# Patient Record
Sex: Female | Born: 1945 | Race: White | Hispanic: No | Marital: Married | Smoking: Never smoker
Health system: Southern US, Community
[De-identification: ages and names within clinical notes are randomized; demographics above are authoritative.]

## PROBLEM LIST (undated history)

## (undated) DIAGNOSIS — N390 Urinary tract infection, site not specified: Secondary | ICD-10-CM

## (undated) HISTORY — PX: EYE SURGERY: SHX253

---

## 2008-01-30 ENCOUNTER — Encounter: Admission: RE | Admit: 2008-01-30 | Discharge: 2008-01-30 | Payer: Self-pay | Admitting: Internal Medicine

## 2011-06-23 ENCOUNTER — Encounter (HOSPITAL_COMMUNITY): Payer: Self-pay

## 2011-06-23 ENCOUNTER — Emergency Department (INDEPENDENT_AMBULATORY_CARE_PROVIDER_SITE_OTHER)
Admission: EM | Admit: 2011-06-23 | Discharge: 2011-06-23 | Disposition: A | Discharge status: Home / Self Care | Payer: Worker's Compensation | Source: Home / Self Care | Attending: Emergency Medicine | Admitting: Emergency Medicine

## 2011-06-23 DIAGNOSIS — S40029A Contusion of unspecified upper arm, initial encounter: Secondary | ICD-10-CM

## 2011-06-23 NOTE — Discharge Instructions (Signed)
This is a hematoma, a collection of blood underneath the skin. Your body will reabsorb the blood. You may use ice for pain and swelling over the next 24 to 48 hours, taking care not to burn (freeze) the skin which can lead to permanent damage. Use may use mild compression (ACE bandage) and warm compresses. Return to care should your symptoms not improve, or worsen in any way.

## 2011-06-23 NOTE — ED Provider Notes (Signed)
History     CSN: [id number]  Arrival date & time 06/23/11  1441   First MD Initiated Contact with Patient 06/23/11 1455      Chief Complaint  Patient presents with  . Arm Injury    (Consider location/radiation/quality/duration/timing/severity/associated sxs/prior treatment) HPI Comments: Alicia Hobbs presents for evaluation of pain, swelling, bruising of her right forearm. She reports that she works at a [employer] and struck her arm on a large plexiglass display. She then noted immediate swelling, bruising, and pain over her forearm. She denies any numbness tingling, or weakness in her upper extremity. She's not on any blood thinners. In fact, she takes no medications other than fish oil.  Patient is a 66 y.o. female presenting with arm injury. The history is provided by the patient.  Arm Injury  The incident occurred just prior to arrival. The incident occurred at work. The injury mechanism was a direct blow. The wounds were self-inflicted. There is an injury to the right forearm. The pain is moderate. Pertinent negatives include no tingling. She is right-handed.    History reviewed. No pertinent past medical history.  History reviewed. No pertinent past surgical history.  History reviewed. No pertinent family history.  History  Substance Use Topics  . Smoking status: Never Smoker   . Smokeless tobacco: Not on file  . Alcohol Use: No    OB History    Grav Para Term Preterm Abortions TAB SAB Ect Mult Living                  Review of Systems  Constitutional: Negative.   HENT: Negative.   Eyes: Negative.   Respiratory: Negative.   Cardiovascular: Negative.   Gastrointestinal: Negative.   Genitourinary: Negative.   Musculoskeletal:       RIGHT forearm hematoma  Skin: Positive for color change and wound.  Neurological: Negative.  Negative for tingling.    Allergies  Review of patient's allergies indicates no known allergies.  Home Medications  No current  outpatient prescriptions on file.  BP 145/90  Pulse 80  Temp(Src) 97.8 F (36.6 C) (Oral)  Resp 16  SpO2 98%  Physical Exam  Nursing note and vitals reviewed. Constitutional: She is oriented to person, place, and time. She appears well-developed and well-nourished.  HENT:  Head: Normocephalic and atraumatic.  Eyes: EOM are normal.  Neck: Normal range of motion.  Pulmonary/Chest: Effort normal.  Musculoskeletal: Normal range of motion.       Arms:      Large 5 cm hematoma over lateral aspect of RIGHT forearm, tender to palpation  Neurological: She is alert and oriented to person, place, and time.  Skin: Skin is warm and dry. Bruising and ecchymosis noted.  Psychiatric: Her behavior is normal.    ED Course  Procedures (including critical care time)  Labs Reviewed - No data to display No results found.   1. Hematoma of arm       MDM  Supportive care, reassured        Renaee Munda, MD 06/23/11 [phone]

## 2011-06-23 NOTE — ED Notes (Signed)
Pt hit rt forearm on display at work today at 1200.  Arm is painful and large hematoma on arm.

## 2012-11-06 ENCOUNTER — Encounter (HOSPITAL_COMMUNITY): Payer: Self-pay | Admitting: *Deleted

## 2012-11-06 ENCOUNTER — Emergency Department (HOSPITAL_COMMUNITY)
Admission: EM | Admit: 2012-11-06 | Discharge: 2012-11-06 | Disposition: A | Discharge status: Home / Self Care | Payer: Worker's Compensation | Attending: Emergency Medicine | Admitting: Emergency Medicine

## 2012-11-06 ENCOUNTER — Emergency Department (HOSPITAL_COMMUNITY): Payer: Worker's Compensation

## 2012-11-06 DIAGNOSIS — S52599A Other fractures of lower end of unspecified radius, initial encounter for closed fracture: Secondary | ICD-10-CM | POA: Insufficient documentation

## 2012-11-06 DIAGNOSIS — S42202A Unspecified fracture of upper end of left humerus, initial encounter for closed fracture: Secondary | ICD-10-CM

## 2012-11-06 DIAGNOSIS — W19XXXA Unspecified fall, initial encounter: Secondary | ICD-10-CM

## 2012-11-06 DIAGNOSIS — Z79899 Other long term (current) drug therapy: Secondary | ICD-10-CM | POA: Insufficient documentation

## 2012-11-06 DIAGNOSIS — W010XXA Fall on same level from slipping, tripping and stumbling without subsequent striking against object, initial encounter: Secondary | ICD-10-CM | POA: Insufficient documentation

## 2012-11-06 DIAGNOSIS — S52502A Unspecified fracture of the lower end of left radius, initial encounter for closed fracture: Secondary | ICD-10-CM

## 2012-11-06 DIAGNOSIS — Y929 Unspecified place or not applicable: Secondary | ICD-10-CM | POA: Insufficient documentation

## 2012-11-06 DIAGNOSIS — S42209A Unspecified fracture of upper end of unspecified humerus, initial encounter for closed fracture: Secondary | ICD-10-CM | POA: Insufficient documentation

## 2012-11-06 DIAGNOSIS — Y939 Activity, unspecified: Secondary | ICD-10-CM | POA: Insufficient documentation

## 2012-11-06 MED ORDER — HYDROCODONE-ACETAMINOPHEN 5-325 MG PO TABS: 2.0000 | ORAL_TABLET | ORAL | Status: DC | PRN

## 2012-11-06 MED ORDER — FENTANYL CITRATE 0.05 MG/ML IJ SOLN
100.0000 ug | Freq: Once | INTRAMUSCULAR | Status: AC
  Administered 2012-11-06: 100 ug via INTRAVENOUS
  Filled 2012-11-06: qty 2

## 2012-11-06 MED ORDER — FENTANYL CITRATE 0.05 MG/ML IJ SOLN
50.0000 ug | Freq: Once | INTRAMUSCULAR | Status: AC
  Administered 2012-11-06: 50 ug via INTRAVENOUS
  Filled 2012-11-06: qty 2

## 2012-11-06 MED ORDER — HYDROCODONE-ACETAMINOPHEN 5-325 MG PO TABS
1.0000 | ORAL_TABLET | Freq: Once | ORAL | Status: AC
  Administered 2012-11-06: 1 via ORAL
  Filled 2012-11-06: qty 1

## 2012-11-06 MED ORDER — ONDANSETRON HCL 4 MG/2ML IJ SOLN
4.0000 mg | Freq: Once | INTRAMUSCULAR | Status: AC
  Administered 2012-11-06: 4 mg via INTRAVENOUS
  Filled 2012-11-06: qty 2

## 2012-11-06 NOTE — ED Notes (Signed)
Patient transported to X-ray 

## 2012-11-06 NOTE — ED Notes (Signed)
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 Pt tripped over shelf in floor and fell onto her left side. Pt c/o left arm pain only.

## 2012-11-06 NOTE — ED Provider Notes (Signed)
CSN: [id number]     Arrival date & time 11/06/12  2124 History   First MD Initiated Contact with Patient 11/06/12 2132     Chief Complaint  Patient presents with  . Fall   (Consider location/radiation/quality/duration/timing/severity/associated sxs/prior Treatment) Patient is a 67 y.o. female presenting with fall. The history is provided by the patient.  Fall Pertinent negatives include no chest pain, no abdominal pain, no headaches and no shortness of breath.  patient presents after a trip and a fall. Complaining of pain in her left shoulder and left wrist.  No head or neck pain. No chest or abdominal pain. No numbness or weakness. She is not on anticoagulation.   History reviewed. No pertinent past medical history. Past Surgical History  Procedure Laterality Date  . Eye surgery     History reviewed. No pertinent family history. History  Substance Use Topics  . Smoking status: Never Smoker   . Smokeless tobacco: Never Used  . Alcohol Use: No   OB History   Grav Para Term Preterm Abortions TAB SAB Ect Mult Living                 Review of Systems  Constitutional: Negative for activity change and appetite change.  HENT: Negative for neck stiffness.   Eyes: Negative for pain.  Respiratory: Negative for chest tightness and shortness of breath.   Cardiovascular: Negative for chest pain and leg swelling.  Gastrointestinal: Negative for nausea, vomiting, abdominal pain and diarrhea.  Genitourinary: Negative for flank pain.  Musculoskeletal: Negative for back pain.  Skin: Negative for rash.  Neurological: Negative for weakness, numbness and headaches.  Psychiatric/Behavioral: Negative for behavioral problems.    Allergies  Biaxin  Home Medications   Current Outpatient Rx  Name  Route  Sig  Dispense  Refill  . b complex vitamins tablet   Oral   Take 1 tablet by mouth daily.         . cholecalciferol (VITAMIN D) 1000 UNITS tablet   Oral   Take 1,000 Units by mouth  daily.         Marland Kitchen co-enzyme Q-10 50 MG capsule   Oral   Take 50 mg by mouth daily.         Marland Kitchen doxylamine, Sleep, (EQ NIGHTTIME SLEEP AID, DOXYL,) 25 MG tablet   Oral   Take 25 mg by mouth at bedtime as needed for sleep.         . fish oil-omega-3 fatty acids 1000 MG capsule   Oral   Take 2 g by mouth daily.         . Multiple Vitamin (MULTIVITAMIN WITH MINERALS) TABS tablet   Oral   Take 1 tablet by mouth daily.         . vitamin C (ASCORBIC ACID) 500 MG tablet   Oral   Take 500 mg by mouth daily.         Marland Kitchen HYDROcodone-acetaminophen (NORCO/VICODIN) 5-325 MG per tablet   Oral   Take 2 tablets by mouth every 4 (four) hours as needed for pain.   10 tablet   0    BP 124/68  Pulse 74  Temp(Src) 97.7 F (36.5 C) (Oral)  Resp 18  SpO2 96% Physical Exam  Constitutional: She is oriented to person, place, and time. She appears well-developed and well-nourished.  HENT:  Head: Normocephalic.  Neck: Normal range of motion. Neck supple.  Cardiovascular: Normal rate and regular rhythm.   Pulmonary/Chest: Effort normal and breath  sounds normal.  Abdominal: Soft.  Musculoskeletal: She exhibits tenderness.  Tenderness to proximal left shoulder. Decreased range of motion. No tenderness over elbow. Tenderness and decreased range of motion of her wrist. Sensation intact in hand.  Neurological: She is alert and oriented to person, place, and time.  Skin: Skin is warm.    ED Course  Procedures (including critical care time) Labs Review Labs Reviewed - No data to display Imaging Review Dg Wrist Complete Left  11/06/2012   *RADIOLOGY REPORT*  Clinical Data: Fall.  Left wrist pain.  LEFT WRIST - COMPLETE 3+ VIEW  Comparison: None.  Findings: There is a transverse fracture of the distal radial metaphysis.  This leads to mild dorsal angulation of the articular surface of 12 degrees.  This is due to mild impaction of the dorsal aspect of the fracture.  There is no displacement.   There are no other fractures.  The wrist joints are normally spaced and aligned. There is surrounding soft tissue swelling.  The bones are demineralized.  IMPRESSION: Transverse fracture of the distal left radial metaphysis.   Original Report Authenticated By: Amie Portland, M.D.   Dg Shoulder Left  11/06/2012   *RADIOLOGY REPORT*  Clinical Data: Fall with left shoulder pain  LEFT SHOULDER - 2+ VIEW  Comparison: 11/06/2004  Findings: There is a fracture of the proximal left humeral metaphysis.  There is a fracture of the proximal humeral metaphysis on the prior study.  Presumably, this appears to be a new fracture. The shaft fracture fragment has retracted superiorly by 1 to 2 cm. There is no dislocation.  There is minimal comminution.  IMPRESSION: Fracture of the proximal humeral metaphysis with mild displacement.   Original Report Authenticated By: Amie Portland, M.D.    MDM   1. Fall, initial encounter   2. Proximal humerus fracture, left, closed, initial encounter   3. Distal radius fracture, left, closed, initial encounter    Patient with mechanical fall. Proximal humerus fracture and distal radius fracture. Splinted and swelling. Will have follow with hand surgery.    Juliet Rude. Rubin Payor, MD 11/06/12 [phone]

## 2012-11-12 ENCOUNTER — Encounter (HOSPITAL_COMMUNITY): Payer: Self-pay | Admitting: *Deleted

## 2012-11-12 MED ORDER — DEXTROSE 5 % IV SOLN
3.0000 g | INTRAVENOUS | Status: AC
  Administered 2012-11-13: 3 g via INTRAVENOUS
  Filled 2012-11-12 (×2): qty 3000

## 2012-11-13 ENCOUNTER — Ambulatory Visit (HOSPITAL_COMMUNITY): Payer: Worker's Compensation | Admitting: Anesthesiology

## 2012-11-13 ENCOUNTER — Ambulatory Visit (HOSPITAL_COMMUNITY): Payer: Worker's Compensation

## 2012-11-13 ENCOUNTER — Ambulatory Visit (HOSPITAL_COMMUNITY)
Admission: RE | Admit: 2012-11-13 | Discharge: 2012-11-13 | Disposition: A | Discharge status: Home / Self Care | Payer: Worker's Compensation | Source: Ambulatory Visit | Attending: Orthopedic Surgery | Admitting: Orthopedic Surgery

## 2012-11-13 ENCOUNTER — Encounter (HOSPITAL_COMMUNITY): Payer: Self-pay | Admitting: Anesthesiology

## 2012-11-13 ENCOUNTER — Encounter (HOSPITAL_COMMUNITY)
Admission: RE | Disposition: A | Discharge status: Home / Self Care | Payer: Self-pay | Source: Ambulatory Visit | Attending: Orthopedic Surgery

## 2012-11-13 DIAGNOSIS — S42202D Unspecified fracture of upper end of left humerus, subsequent encounter for fracture with routine healing: Secondary | ICD-10-CM

## 2012-11-13 DIAGNOSIS — W19XXXA Unspecified fall, initial encounter: Secondary | ICD-10-CM | POA: Insufficient documentation

## 2012-11-13 DIAGNOSIS — Z79899 Other long term (current) drug therapy: Secondary | ICD-10-CM | POA: Insufficient documentation

## 2012-11-13 DIAGNOSIS — S42213A Unspecified displaced fracture of surgical neck of unspecified humerus, initial encounter for closed fracture: Secondary | ICD-10-CM | POA: Insufficient documentation

## 2012-11-13 HISTORY — PX: ORIF HUMERUS FRACTURE: SHX2126

## 2012-11-13 HISTORY — DX: Urinary tract infection, site not specified: N39.0

## 2012-11-13 LAB — CBC
HCT: 37.1 % (ref 36.0–46.0)
Hemoglobin: 13 g/dL (ref 12.0–15.0)
MCH: 30.9 pg (ref 26.0–34.0)
MCHC: 35 g/dL (ref 30.0–36.0)
RBC: 4.21 MIL/uL (ref 3.87–5.11)

## 2012-11-13 LAB — BASIC METABOLIC PANEL
BUN: 14 mg/dL (ref 6–23)
CO2: 24 mEq/L (ref 19–32)
GFR calc non Af Amer: 89 mL/min — ABNORMAL LOW (ref >90)
Glucose, Bld: 101 mg/dL — ABNORMAL HIGH (ref 70–99)
Potassium: 3.7 mEq/L (ref 3.5–5.1)

## 2012-11-13 SURGERY — OPEN REDUCTION INTERNAL FIXATION (ORIF) PROXIMAL HUMERUS FRACTURE
Anesthesia: General | Site: Arm Upper | Laterality: Left | Wound class: Clean

## 2012-11-13 MED ORDER — PROPOFOL 10 MG/ML IV BOLUS
INTRAVENOUS | Status: DC | PRN
  Administered 2012-11-13 (×4): 20 mg via INTRAVENOUS
  Administered 2012-11-13: 200 mg via INTRAVENOUS

## 2012-11-13 MED ORDER — ROCURONIUM BROMIDE 100 MG/10ML IV SOLN
INTRAVENOUS | Status: DC | PRN
  Administered 2012-11-13: 40 mg via INTRAVENOUS

## 2012-11-13 MED ORDER — DEXAMETHASONE SODIUM PHOSPHATE 10 MG/ML IJ SOLN
INTRAMUSCULAR | Status: DC | PRN
  Administered 2012-11-13: 8 mg via INTRAVENOUS

## 2012-11-13 MED ORDER — MIDAZOLAM HCL 2 MG/2ML IJ SOLN
INTRAMUSCULAR | Status: AC
  Administered 2012-11-13: 2 mg via INTRAVENOUS
  Filled 2012-11-13: qty 2

## 2012-11-13 MED ORDER — NAPROXEN 500 MG PO TABS: 500.0000 mg | ORAL_TABLET | Freq: Two times a day (BID) | ORAL | Status: DC

## 2012-11-13 MED ORDER — LIDOCAINE HCL 1 % IJ SOLN
INTRAMUSCULAR | Status: DC | PRN
  Administered 2012-11-13: 1 mL via INTRADERMAL

## 2012-11-13 MED ORDER — EPHEDRINE SULFATE 50 MG/ML IJ SOLN
INTRAMUSCULAR | Status: DC | PRN
  Administered 2012-11-13: 5 mg via INTRAVENOUS
  Administered 2012-11-13: 15 mg via INTRAVENOUS
  Administered 2012-11-13: 5 mg via INTRAVENOUS
  Administered 2012-11-13: 15 mg via INTRAVENOUS
  Administered 2012-11-13: 10 mg via INTRAVENOUS
  Administered 2012-11-13 (×2): 5 mg via INTRAVENOUS

## 2012-11-13 MED ORDER — ROPIVACAINE HCL 5 MG/ML IJ SOLN
INTRAMUSCULAR | Status: DC | PRN
  Administered 2012-11-13: 20 mL

## 2012-11-13 MED ORDER — LIDOCAINE HCL (CARDIAC) 20 MG/ML IV SOLN
INTRAVENOUS | Status: DC | PRN
  Administered 2012-11-13: 100 mg via INTRAVENOUS

## 2012-11-13 MED ORDER — CHLORHEXIDINE GLUCONATE 4 % EX LIQD: 60.0000 mL | Freq: Once | CUTANEOUS | Status: DC

## 2012-11-13 MED ORDER — ONDANSETRON HCL 4 MG/2ML IJ SOLN
INTRAMUSCULAR | Status: DC | PRN
  Administered 2012-11-13 (×2): 4 mg via INTRAVENOUS

## 2012-11-13 MED ORDER — LACTATED RINGERS IV SOLN
INTRAVENOUS | Status: DC
  Administered 2012-11-13: 09:00:00 via INTRAVENOUS

## 2012-11-13 MED ORDER — GLYCOPYRROLATE 0.2 MG/ML IJ SOLN
INTRAMUSCULAR | Status: DC | PRN
  Administered 2012-11-13: 0.2 mg via INTRAVENOUS
  Administered 2012-11-13: 0.4 mg via INTRAVENOUS

## 2012-11-13 MED ORDER — FENTANYL CITRATE 0.05 MG/ML IJ SOLN
INTRAMUSCULAR | Status: AC
  Filled 2012-11-13: qty 2

## 2012-11-13 MED ORDER — FENTANYL CITRATE 0.05 MG/ML IJ SOLN
INTRAMUSCULAR | Status: DC | PRN
  Administered 2012-11-13 (×3): 50 ug via INTRAVENOUS
  Administered 2012-11-13: 100 ug via INTRAVENOUS

## 2012-11-13 MED ORDER — HYDROCODONE-ACETAMINOPHEN 10-325 MG PO TABS: 1.0000 | ORAL_TABLET | Freq: Four times a day (QID) | ORAL | Status: DC | PRN

## 2012-11-13 MED ORDER — FENTANYL CITRATE 0.05 MG/ML IJ SOLN
50.0000 ug | Freq: Once | INTRAMUSCULAR | Status: AC
  Administered 2012-11-13: 50 ug via INTRAVENOUS

## 2012-11-13 MED ORDER — ARTIFICIAL TEARS OP OINT
TOPICAL_OINTMENT | OPHTHALMIC | Status: DC | PRN
  Administered 2012-11-13: 1 via OPHTHALMIC

## 2012-11-13 MED ORDER — METHOCARBAMOL 500 MG PO TABS: 500.0000 mg | ORAL_TABLET | Freq: Four times a day (QID) | ORAL | Status: DC

## 2012-11-13 MED ORDER — 0.9 % SODIUM CHLORIDE (POUR BTL) OPTIME
TOPICAL | Status: DC | PRN
  Administered 2012-11-13: 1000 mL

## 2012-11-13 MED ORDER — LACTATED RINGERS IV SOLN
INTRAVENOUS | Status: DC | PRN
  Administered 2012-11-13 (×2): via INTRAVENOUS

## 2012-11-13 MED ORDER — NEOSTIGMINE METHYLSULFATE 1 MG/ML IJ SOLN
INTRAMUSCULAR | Status: DC | PRN
  Administered 2012-11-13: 2 mg via INTRAVENOUS
  Administered 2012-11-13: 3 mg via INTRAVENOUS

## 2012-11-13 SURGICAL SUPPLY — 63 items
BENZOIN TINCTURE PRP APPL 2/3 (GAUZE/BANDAGES/DRESSINGS) ×2 IMPLANT
BIT DRILL 4 LONG FAST STEP (BIT) ×2 IMPLANT
BIT DRILL 4 SHORT FAST STEP (BIT) ×2 IMPLANT
BIT DRILL SNP 4.0MM (BIT) ×1 IMPLANT
CLOTH BEACON ORANGE TIMEOUT ST (SAFETY) ×2 IMPLANT
DRAPE C-ARM 42X72 X-RAY (DRAPES) ×2 IMPLANT
DRAPE INCISE IOBAN 66X45 STRL (DRAPES) ×2 IMPLANT
DRAPE POUCH INSTRU U-SHP 10X18 (DRAPES) ×2 IMPLANT
DRAPE SURG 17X11 SM STRL (DRAPES) ×2 IMPLANT
DRAPE U-SHAPE 47X51 STRL (DRAPES) ×2 IMPLANT
DRILL BIT SNP 4.0MM (BIT) ×1
DRSG EMULSION OIL 3X3 NADH (GAUZE/BANDAGES/DRESSINGS) IMPLANT
DRSG MEPILEX BORDER 4X8 (GAUZE/BANDAGES/DRESSINGS) ×2 IMPLANT
DRSG PAD ABDOMINAL 8X10 ST (GAUZE/BANDAGES/DRESSINGS) ×2 IMPLANT
ELECT CAUTERY BLADE 6.4 (BLADE) ×2 IMPLANT
ELECT REM PT RETURN 9FT ADLT (ELECTROSURGICAL) ×2
ELECTRODE REM PT RTRN 9FT ADLT (ELECTROSURGICAL) ×1 IMPLANT
GLOVE BIO SURGEON STRL SZ7.5 (GLOVE) IMPLANT
GLOVE BIO SURGEON STRL SZ8 (GLOVE) ×2 IMPLANT
GLOVE EUDERMIC 7 POWDERFREE (GLOVE) IMPLANT
GLOVE SS BIOGEL STRL SZ 7.5 (GLOVE) ×2 IMPLANT
GLOVE SUPERSENSE BIOGEL SZ 7.5 (GLOVE) ×2
GOWN STRL NON-REIN LRG LVL3 (GOWN DISPOSABLE) IMPLANT
GOWN STRL REIN XL XLG (GOWN DISPOSABLE) ×4 IMPLANT
KIT BASIN OR (CUSTOM PROCEDURE TRAY) ×2 IMPLANT
KIT ROOM TURNOVER OR (KITS) ×4 IMPLANT
MANIFOLD NEPTUNE II (INSTRUMENTS) ×2 IMPLANT
NEEDLE 22X1 1/2 (OR ONLY) (NEEDLE) IMPLANT
NS IRRIG 1000ML POUR BTL (IV SOLUTION) ×2 IMPLANT
PACK SHOULDER (CUSTOM PROCEDURE TRAY) ×2 IMPLANT
PAD ARMBOARD 7.5X6 YLW CONV (MISCELLANEOUS) ×4 IMPLANT
PEG STND 4.0X30MM (Orthopedic Implant) ×2 IMPLANT
PEG STND 4.0X35MM (Orthopedic Implant) ×4 IMPLANT
PEG STND 4.0X40MM (Orthopedic Implant) ×6 IMPLANT
PEGSTD 4.0X30MM (Orthopedic Implant) ×1 IMPLANT
PEGSTD 4.0X35MM (Orthopedic Implant) ×2 IMPLANT
PEGSTD 4.0X40MM (Orthopedic Implant) ×3 IMPLANT
PIN GUIDE SHOULDER 2.0MM (PIN) ×2 IMPLANT
PLATE SZ3 SHOULDER NAIL SNP (Plate) ×2 IMPLANT
SCREW SNP UNICORTICAL (Screw) ×6 IMPLANT
SLING ARM FOAM STRAP LRG (SOFTGOODS) ×2 IMPLANT
SPLINT PLASTER CAST XFAST 5X30 (CAST SUPPLIES) ×1 IMPLANT
SPLINT PLASTER XFAST SET 5X30 (CAST SUPPLIES) ×1
SPONGE GAUZE 4X4 12PLY (GAUZE/BANDAGES/DRESSINGS) ×2 IMPLANT
SPONGE LAP 18X18 X RAY DECT (DISPOSABLE) ×2 IMPLANT
STRIP CLOSURE SKIN 1/2X4 (GAUZE/BANDAGES/DRESSINGS) ×2 IMPLANT
SUCTION FRAZIER TIP 10 FR DISP (SUCTIONS) ×2 IMPLANT
SUT ETHIBOND NAB CT1 #1 30IN (SUTURE) ×2 IMPLANT
SUT FIBERWIRE #2 38 T-5 BLUE (SUTURE)
SUT MNCRL AB 3-0 PS2 18 (SUTURE) ×2 IMPLANT
SUT VIC AB 0 CT1 27 (SUTURE) ×1
SUT VIC AB 0 CT1 27XBRD ANBCTR (SUTURE) ×1 IMPLANT
SUT VIC AB 1 CT1 27 (SUTURE) ×1
SUT VIC AB 1 CT1 27XBRD ANBCTR (SUTURE) ×1 IMPLANT
SUT VIC AB 2-0 CT1 27 (SUTURE) ×2
SUT VIC AB 2-0 CT1 TAPERPNT 27 (SUTURE) ×2 IMPLANT
SUT VICRYL 4-0 PS2 18IN ABS (SUTURE) ×2 IMPLANT
SUTURE FIBERWR #2 38 T-5 BLUE (SUTURE) IMPLANT
SYR CONTROL 10ML LL (SYRINGE) IMPLANT
TOWEL OR 17X24 6PK STRL BLUE (TOWEL DISPOSABLE) ×2 IMPLANT
TOWEL OR 17X26 10 PK STRL BLUE (TOWEL DISPOSABLE) ×2 IMPLANT
WATER STERILE IRR 1000ML POUR (IV SOLUTION) IMPLANT
YANKAUER SUCT BULB TIP NO VENT (SUCTIONS) IMPLANT

## 2012-11-13 NOTE — Anesthesia Procedure Notes (Addendum)
Anesthesia Regional Block:  Interscalene brachial plexus block  Pre-Anesthetic Checklist: ,, timeout performed, Correct Patient, Correct Site, Correct Laterality, Correct Procedure, Correct Position, site marked, Risks and benefits discussed,  Surgical consent,  Pre-op evaluation,  At surgeon's request and post-op pain management  Laterality: Left  Prep: chloraprep       Needles:   Needle Type: Other     Needle Length: 9cm  Needle Gauge: 21 and 21 G    Additional Needles:  Procedures: ultrasound guided (picture in chart) Interscalene brachial plexus block Narrative:  Start time: 11/13/2012 9:04 AM End time: 11/13/2012 9:12 AM Injection made incrementally with aspirations every 5 mL.  Performed by: Personally  Anesthesiologist: Aldona Lento, MD  Additional Notes: Ultrasound guidance used to: id relevant anatomy, confirm needle position, local anesthetic spread, avoidance of vascular puncture. Picture saved. No complications. Block performed personally by Janetta Hora. Gelene Mink, MD    Interscalene brachial plexus block Procedure Name: Intubation Date/Time: 11/13/2012 10:47 AM Performed by: Sherie Don Pre-anesthesia Checklist: Patient identified, Emergency Drugs available, Suction available, Patient being monitored and Timeout performed Patient Re-evaluated:Patient Re-evaluated prior to inductionOxygen Delivery Method: Circle system utilized Preoxygenation: Pre-oxygenation with 100% oxygen Intubation Type: IV induction Ventilation: Mask ventilation without difficulty Laryngoscope Size: Mac and 3 Grade View: Grade II Tube type: Oral Tube size: 7.5 mm Number of attempts: 1 Airway Equipment and Method: Stylet Placement Confirmation: ETT inserted through vocal cords under direct vision,  breath sounds checked- equal and bilateral and positive ETCO2 Secured at: 21 cm Tube secured with: Tape Dental Injury: Teeth and Oropharynx as per pre-operative assessment

## 2012-11-13 NOTE — H&P (Signed)
Tyson Babinski    Chief Complaint: LEFT PROXIMAL HUMERUS FRACTURE HPI: The patient is a 67 y.o. female with a moderately angulated left proximal humerus fracture.  Past Medical History  Diagnosis Date  . UTI (lower urinary tract infection)     Past Surgical History  Procedure Laterality Date  . 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 No pertinent family history.  Social History:  reports that she has never smoked. She has never used smokeless tobacco. She reports that she does not drink alcohol or use illicit drugs.  Allergies:  Allergies  Allergen Reactions  . Percocet [Oxycodone-Acetaminophen] Nausea And Vomiting  . Biaxin [Clarithromycin]     Medications Prior to Admission  Medication Sig Dispense Refill  . b complex vitamins tablet Take 1 tablet by mouth daily.      . cholecalciferol (VITAMIN D) 1000 UNITS tablet Take 1,000 Units by mouth daily.      . ciprofloxacin (CIPRO) 500 MG tablet Take 500 mg by mouth 2 (two) times daily.      Marland Kitchen co-enzyme Q-10 50 MG capsule Take 50 mg by mouth daily.      . diphenhydrAMINE (BENADRYL) 25 MG tablet Take 25 mg by mouth every 6 (six) hours as needed for itching.      Marland Kitchen doxylamine, Sleep, (EQ NIGHTTIME SLEEP AID, DOXYL,) 25 MG tablet Take 25 mg by mouth at bedtime as needed for sleep.      . fish oil-omega-3 fatty acids 1000 MG capsule Take 2 g by mouth daily.      Marland Kitchen HYDROcodone-acetaminophen (NORCO/VICODIN) 5-325 MG per tablet Take 2 tablets by mouth every 4 (four) hours as needed for pain.  10 tablet  0  . methocarbamol (ROBAXIN) 500 MG tablet Take 500 mg by mouth 4 (four) times daily.      . Multiple Vitamin (MULTIVITAMIN WITH MINERALS) TABS tablet Take 1 tablet by mouth daily.      . vitamin C (ASCORBIC ACID) 500 MG tablet Take 500 mg by mouth daily.         Physical Exam: left shoulder with painful and restricted ROM, N/V intact, L Wrist splinted with nondisplaced distal radius fracture.  Vitals  Temp:   [97.3 F (36.3 C)] 97.3 F (36.3 C) (09/04 0806) Pulse Rate:  [60-74] 60 (09/04 0945) Resp:  [13-20] 13 (09/04 0945) BP: (112-129)/(44-54) 116/54 mmHg (09/04 0945) SpO2:  [96 %-98 %] 96 % (09/04 0945) Weight:  [71.4 kg (157 lb 6.5 oz)] 71.4 kg (157 lb 6.5 oz) (09/04 0806)  Assessment/Plan  Impression: angulated LEFT PROXIMAL HUMERUS FRACTURE  Plan of Action: Procedure(s): OPEN REDUCTION INTERNAL FIXATION (ORIF) PROXIMAL HUMERUS FRACTURE  Lilianna Case M 11/13/2012, 10:08 AM

## 2012-11-13 NOTE — Anesthesia Postprocedure Evaluation (Signed)
  Anesthesia Post-op Note  Patient: Alicia Hobbs  Procedure(s) Performed: Procedure(s): OPEN REDUCTION INTERNAL FIXATION (ORIF) PROXIMAL HUMERUS FRACTURE (Left)  Patient Location: PACU  Anesthesia Type:GA combined with regional for post-op pain  Level of Consciousness: awake, alert  and oriented  Airway and Oxygen Therapy: Patient Spontanous Breathing  Post-op Pain: none  Post-op Assessment: Post-op Vital signs reviewed, Patient's Cardiovascular Status Stable, Respiratory Function Stable, Patent Airway and No signs of Nausea or vomiting  Post-op Vital Signs: Reviewed and stable  Complications: No apparent anesthesia complications

## 2012-11-13 NOTE — Op Note (Signed)
11/13/2012  12:21 PM  PATIENT:   Alicia Hobbs  67 y.o. female  PRE-OPERATIVE DIAGNOSIS:  LEFT PROXIMAL HUMERUS FRACTURE  POST-OPERATIVE DIAGNOSIS:  same  PROCEDURE:  ORIF  SURGEON:  Dmiya Malphrus, Vania Rea. M.D.  ASSISTANTS: Roberts pac   ANESTHESIA:   GET + ISB  EBL: 150  SPECIMEN:  none  Drains: none   PATIENT DISPOSITION:  PACU - hemodynamically stable.    PLAN OF CARE: Discharge to home after PACU  Dictation# [id number]

## 2012-11-13 NOTE — Anesthesia Preprocedure Evaluation (Signed)
Anesthesia Evaluation  Patient identified by MRN, date of birth, ID band Patient awake    Reviewed: Allergy & Precautions, H&P , NPO status , Patient's Chart, lab work & pertinent test results, reviewed documented beta blocker date and time   Airway Mallampati: II TM Distance: >3 FB Neck ROM: full    Dental   Pulmonary neg pulmonary ROS,  breath sounds clear to auscultation        Cardiovascular negative cardio ROS  Rhythm:regular     Neuro/Psych negative neurological ROS  negative psych ROS   GI/Hepatic negative GI ROS, Neg liver ROS,   Endo/Other  negative endocrine ROS  Renal/GU negative Renal ROS  negative genitourinary   Musculoskeletal   Abdominal   Peds  Hematology negative hematology ROS (+)   Anesthesia Other Findings See surgeon's H&P   Reproductive/Obstetrics negative OB ROS                           Anesthesia Physical Anesthesia Plan  ASA: II  Anesthesia Plan: General   Post-op Pain Management:    Induction: Intravenous  Airway Management Planned: Oral ETT  Additional Equipment:   Intra-op Plan:   Post-operative Plan: Extubation in OR  Informed Consent: I have reviewed the patients History and Physical, chart, labs and discussed the procedure including the risks, benefits and alternatives for the proposed anesthesia with the patient or authorized representative who has indicated his/her understanding and acceptance.   Dental Advisory Given  Plan Discussed with: CRNA and Surgeon  Anesthesia Plan Comments:         Anesthesia Quick Evaluation  

## 2012-11-13 NOTE — OR Nursing (Signed)
No change in movement of fingers/thumb/ sensation remains diminished

## 2012-11-13 NOTE — OR Nursing (Signed)
Can wiggle fingers/not able to thumbs up/ limited movement d/t scalene block, states hand feels numb.

## 2012-11-13 NOTE — Preoperative (Signed)
Beta Blockers   Reason not to administer Beta Blockers:Not Applicable 

## 2012-11-13 NOTE — Transfer of Care (Signed)
Immediate Anesthesia Transfer of Care Note  Patient: Alicia Hobbs  Procedure(s) Performed: Procedure(s): OPEN REDUCTION INTERNAL FIXATION (ORIF) PROXIMAL HUMERUS FRACTURE (Left)  Patient Location: PACU  Anesthesia Type:General  Level of Consciousness: sedated and patient cooperative  Airway & Oxygen Therapy: Patient Spontanous Breathing and Patient connected to face mask oxygen  Post-op Assessment: Report given to PACU RN and Post -op Vital signs reviewed and stable  Post vital signs: Reviewed and stable  Complications: No apparent anesthesia complications

## 2012-11-14 ENCOUNTER — Encounter (HOSPITAL_COMMUNITY): Payer: Self-pay | Admitting: Orthopedic Surgery

## 2012-11-14 NOTE — Progress Notes (Signed)
Pt coughing, assured that it is normal

## 2012-11-14 NOTE — Op Note (Signed)
NAMEPAMI, WOOL NO.:  [id number]  MEDICAL RECORD NO.:  [id number]  LOCATION:  [facility]                         FACILITY:  [facility]  PHYSICIAN:  Vania Rea. Maeve Debord, M.D.  DATE OF BIRTH:  [birth year]  DATE OF PROCEDURE:  11/13/2012 DATE OF DISCHARGE:  11/13/2012                              OPERATIVE REPORT   PREOPERATIVE DIAGNOSIS:  Displaced left proximal humerus fracture.  POSTOPERATIVE DIAGNOSIS:  Displaced left proximal humerus fracture.  PROCEDURE:  Open reduction and internal fixation of left displaced proximal humerus fracture.  SURGEON:  Vania Rea. Nate Common, M.D.  ASSISTANT:  Skip Mayer, PA-C.  ANESTHESIA:  General endotracheal as well as an interscalene block.  ESTIMATED BLOOD LOSS:  150 mL.  DRAINS:  None.  HISTORY:  Ms. Bolte is a 67 year old female, who fell last week sustaining a moderately angulated left two-part proximal humerus fracture.  Due to degree of displacement and angulation, she was brought to the operating room at this time for planned ORIF.  We counseled Ms. Barretto on treatment options as well as risks versus benefits thereof.  Possible surgical complications were reviewed including potential for bleeding, infection, neurovascular injury, malunion, nonunion, loss of fixation, anesthetic complication, possible need for additional surgery.  She understands and accepts and agrees with our planned procedure.  PROCEDURE IN DETAIL:  After undergoing routine preop evaluation, the patient received prophylactic antibiotics and interscalene block was established in the holding area by the Anesthesia Department.  Placed supine on the operating table, underwent smooth induction of a general endotracheal anesthesia.  Placed into beach-chair position and appropriate padding protected.  Left shoulder girdle region was sterilely prepped and draped in standard fashion.  Time-out was called. A lateral deltoid splitting approach was made  through an incision, beginning over the acromion and extending over the lateral deltoid total of approximately 6 cm.  Dissection carried deeply and then we split the deltoid from the lateral margin acromion approximately 5 cm and gained access to the subacromial/subdeltoid bursal space.  The fracture site was then identified and I introduced the starting awl into the humeral canal and then used a rongeur to remove a wedge of the humeral cortical bone antral laterally to allow for proper seating of our implant.  The implant was introduced into the humeral medullary canal, positioning confirmed fluoroscopically.  We then performed a reduction maneuver and then using fluoroscopic guidance, placed our initial guide pin in a center position into the humeral head.  Once this was completed, we went ahead and hand drilled all the stabilization pegs up into the humeral head, and then placed the central PEG as well, and these all obtained good bony purchase and proper position was confirmed fluoroscopically. At this point, we then used the outrigger guide to place the 3 unicortical locking screws into the shaft which obtained good purchase and fixation.  The final fluoroscopic imaging showed that all pegs were properly contained within the humeral head with good alignment at the fracture site.  The wound was irrigated.  Hemostasis was obtained.  The deltoid split was then repaired with a series of figure-of-eight #1 Vicryl sutures, 2-0 Vicryl was used for the subcutaneous layer and intracuticular 3-0 Monocryl  for the skin followed by dry dressing.  At the completion, we went ahead and obtained a fluoroscopic image of the left wrist to confirm that the overall alignment of the previously noted nondisplaced distal right radius fracture had been maintained and indeed the overall alignment was unchanged.  So, at the completion of the case, we reapplied a sugar-tong splint to the left upper  extremity with the wrist in neutral alignment.  Skip Mayer, PA-C, was used as the assistant throughout this case essential for help with positioning of the extremity, retraction, wound closure, and intraoperative decision making.     Vania Rea. Collins Dimaria, M.D.     KMS/MEDQ  D:  11/13/2012  T:  11/14/2012  Job:  [id number]

## 2013-03-09 ENCOUNTER — Encounter (HOSPITAL_COMMUNITY): Payer: Self-pay

## 2013-03-13 ENCOUNTER — Encounter (HOSPITAL_COMMUNITY): Payer: Self-pay

## 2013-03-13 ENCOUNTER — Encounter (HOSPITAL_COMMUNITY)
Admission: RE | Admit: 2013-03-13 | Discharge: 2013-03-13 | Disposition: A | Discharge status: Home / Self Care | Payer: Worker's Compensation | Source: Ambulatory Visit | Attending: Orthopedic Surgery | Admitting: Orthopedic Surgery

## 2013-03-13 DIAGNOSIS — Z01818 Encounter for other preprocedural examination: Secondary | ICD-10-CM | POA: Insufficient documentation

## 2013-03-13 DIAGNOSIS — Z01812 Encounter for preprocedural laboratory examination: Secondary | ICD-10-CM | POA: Insufficient documentation

## 2013-03-13 LAB — CBC
HCT: 42.2 % (ref 36.0–46.0)
HEMOGLOBIN: 14.7 g/dL (ref 12.0–15.0)
MCH: 30.4 pg (ref 26.0–34.0)
MCHC: 34.8 g/dL (ref 30.0–36.0)
MCV: 87.2 fL (ref 78.0–100.0)
Platelets: 284 10*3/uL (ref 150–400)
RBC: 4.84 MIL/uL (ref 3.87–5.11)
RDW: 13.3 % (ref 11.5–15.5)
WBC: 5.2 10*3/uL (ref 4.0–10.5)

## 2013-03-13 LAB — BASIC METABOLIC PANEL
BUN: 17 mg/dL (ref 6–23)
CHLORIDE: 105 meq/L (ref 96–112)
CO2: 25 mEq/L (ref 19–32)
Calcium: 9.7 mg/dL (ref 8.4–10.5)
Creatinine, Ser: 0.61 mg/dL (ref 0.50–1.10)
GFR calc non Af Amer: >90 mL/min (ref >90)
Glucose, Bld: 84 mg/dL (ref 70–99)
POTASSIUM: 3.6 meq/L — AB (ref 3.7–5.3)
SODIUM: 147 meq/L (ref 137–147)

## 2013-03-13 NOTE — Pre-Procedure Instructions (Addendum)
Alicia Hobbs  03/13/2013   Your procedure is scheduled on:  03/17/2013  Report to [place]  2 * 3   ADMITTING OFFICE- [facility],  Main Entrance - at 1:30 PM.  Call this number if you have problems the morning of surgery: [phone]   Remember:   Do not eat food or drink liquids after midnight.  On Monday    Take these medicines the morning of surgery with A SIP OF WATER: NONE   Do not wear jewelry, make-up or nail polish.  Do not wear lotions, powders, or perfumes.   Do not shave 48 hours prior to surgery.   Do not bring valuables to the hospital.  [facility] is not responsible                  for any belongings or valuables.               Contacts, dentures or bridgework may not be worn into surgery.  Leave suitcase in the car. After surgery it may be brought to your room.  For patients admitted to the hospital, discharge time is determined by your                treatment team.               Patients discharged the day of surgery will not be allowed to drive  home.  Name and phone number of your driver:  Spouse  Special Instructions: Shower using CHG 2 nights before surgery and the night before surgery.  If you shower the day of surgery use CHG.  Use special wash - you have one bottle of CHG for all showers.  You should use approximately 1/3 of the bottle for each shower.   Please read over the following fact sheets that you were given: Pain Booklet, Coughing and Deep Breathing and Surgical Site Infection Prevention

## 2013-03-16 MED ORDER — DEXTROSE 5 % IV SOLN
3.0000 g | INTRAVENOUS | Status: AC
  Administered 2013-03-17: 3 g via INTRAVENOUS
  Filled 2013-03-16: qty 3000

## 2013-03-17 ENCOUNTER — Encounter (HOSPITAL_COMMUNITY): Payer: Worker's Compensation | Admitting: Anesthesiology

## 2013-03-17 ENCOUNTER — Encounter (HOSPITAL_COMMUNITY): Payer: Self-pay | Admitting: *Deleted

## 2013-03-17 ENCOUNTER — Ambulatory Visit (HOSPITAL_COMMUNITY): Payer: Worker's Compensation | Admitting: Anesthesiology

## 2013-03-17 ENCOUNTER — Ambulatory Visit (HOSPITAL_COMMUNITY): Payer: Worker's Compensation

## 2013-03-17 ENCOUNTER — Ambulatory Visit (HOSPITAL_COMMUNITY)
Admission: RE | Admit: 2013-03-17 | Discharge: 2013-03-17 | Disposition: A | Discharge status: Home / Self Care | Payer: Worker's Compensation | Source: Ambulatory Visit | Attending: Orthopedic Surgery | Admitting: Orthopedic Surgery

## 2013-03-17 ENCOUNTER — Encounter (HOSPITAL_COMMUNITY)
Admission: RE | Disposition: A | Discharge status: Home / Self Care | Payer: Self-pay | Source: Ambulatory Visit | Attending: Orthopedic Surgery

## 2013-03-17 DIAGNOSIS — Z472 Encounter for removal of internal fixation device: Secondary | ICD-10-CM | POA: Insufficient documentation

## 2013-03-17 HISTORY — PX: ORIF SHOULDER FRACTURE: SHX5035

## 2013-03-17 SURGERY — OPEN REDUCTION INTERNAL FIXATION (ORIF) SHOULDER FRACTURE
Anesthesia: Regional | Site: Shoulder | Laterality: Left

## 2013-03-17 MED ORDER — PROPOFOL 10 MG/ML IV BOLUS
INTRAVENOUS | Status: DC | PRN
  Administered 2013-03-17: 150 mg via INTRAVENOUS

## 2013-03-17 MED ORDER — FENTANYL CITRATE 0.05 MG/ML IJ SOLN
INTRAMUSCULAR | Status: DC | PRN
  Administered 2013-03-17 (×2): 50 ug via INTRAVENOUS

## 2013-03-17 MED ORDER — OXYCODONE HCL 5 MG/5ML PO SOLN: 5.0000 mg | Freq: Once | ORAL | Status: DC | PRN

## 2013-03-17 MED ORDER — ONDANSETRON HCL 4 MG/2ML IJ SOLN: 4.0000 mg | Freq: Four times a day (QID) | INTRAMUSCULAR | Status: DC | PRN

## 2013-03-17 MED ORDER — LACTATED RINGERS IV SOLN
INTRAVENOUS | Status: DC
  Administered 2013-03-17: 14:00:00 via INTRAVENOUS

## 2013-03-17 MED ORDER — 0.9 % SODIUM CHLORIDE (POUR BTL) OPTIME
TOPICAL | Status: DC | PRN
  Administered 2013-03-17: 1000 mL

## 2013-03-17 MED ORDER — LIDOCAINE HCL (CARDIAC) 20 MG/ML IV SOLN
INTRAVENOUS | Status: DC | PRN
  Administered 2013-03-17: 100 mg via INTRAVENOUS

## 2013-03-17 MED ORDER — MIDAZOLAM HCL 2 MG/2ML IJ SOLN
INTRAMUSCULAR | Status: AC
  Administered 2013-03-17: 1 mg
  Filled 2013-03-17: qty 2

## 2013-03-17 MED ORDER — ONDANSETRON HCL 4 MG/2ML IJ SOLN
INTRAMUSCULAR | Status: DC | PRN
  Administered 2013-03-17: 4 mg via INTRAVENOUS

## 2013-03-17 MED ORDER — GLYCOPYRROLATE 0.2 MG/ML IJ SOLN
INTRAMUSCULAR | Status: DC | PRN
  Administered 2013-03-17: 0.6 mg via INTRAVENOUS

## 2013-03-17 MED ORDER — FENTANYL CITRATE 0.05 MG/ML IJ SOLN: 50.0000 ug | INTRAMUSCULAR | Status: DC | PRN

## 2013-03-17 MED ORDER — MIDAZOLAM HCL 2 MG/2ML IJ SOLN: 1.0000 mg | INTRAMUSCULAR | Status: DC | PRN

## 2013-03-17 MED ORDER — ROCURONIUM BROMIDE 100 MG/10ML IV SOLN
INTRAVENOUS | Status: DC | PRN
  Administered 2013-03-17: 35 mg via INTRAVENOUS

## 2013-03-17 MED ORDER — DEXAMETHASONE SODIUM PHOSPHATE 10 MG/ML IJ SOLN
INTRAMUSCULAR | Status: DC | PRN
  Administered 2013-03-17: 4 mg via INTRAVENOUS

## 2013-03-17 MED ORDER — NEOSTIGMINE METHYLSULFATE 1 MG/ML IJ SOLN
INTRAMUSCULAR | Status: DC | PRN
Start: 2013-03-17 — End: 2013-03-17
  Administered 2013-03-17: 4 mg via INTRAVENOUS

## 2013-03-17 MED ORDER — DIAZEPAM 2 MG PO TABS: 2.0000 mg | ORAL_TABLET | Freq: Four times a day (QID) | ORAL | Status: AC | PRN

## 2013-03-17 MED ORDER — HYDROCODONE-ACETAMINOPHEN 5-325 MG PO TABS: 1.0000 | ORAL_TABLET | ORAL | Status: AC | PRN

## 2013-03-17 MED ORDER — OXYCODONE HCL 5 MG PO TABS: 5.0000 mg | ORAL_TABLET | Freq: Once | ORAL | Status: DC | PRN

## 2013-03-17 MED ORDER — HYDROMORPHONE HCL PF 1 MG/ML IJ SOLN: 0.2500 mg | INTRAMUSCULAR | Status: DC | PRN

## 2013-03-17 MED ORDER — PHENYLEPHRINE HCL 10 MG/ML IJ SOLN
INTRAMUSCULAR | Status: DC | PRN
  Administered 2013-03-17: 80 ug via INTRAVENOUS

## 2013-03-17 MED ORDER — FENTANYL CITRATE 0.05 MG/ML IJ SOLN
INTRAMUSCULAR | Status: AC
  Administered 2013-03-17: 50 ug
  Filled 2013-03-17: qty 2

## 2013-03-17 MED ORDER — BUPIVACAINE-EPINEPHRINE PF 0.5-1:200000 % IJ SOLN
INTRAMUSCULAR | Status: DC | PRN
  Administered 2013-03-17: 30 mL

## 2013-03-17 MED ORDER — LACTATED RINGERS IV SOLN
INTRAVENOUS | Status: DC | PRN
  Administered 2013-03-17: 14:00:00 via INTRAVENOUS

## 2013-03-17 MED ORDER — MIDAZOLAM HCL 5 MG/5ML IJ SOLN
INTRAMUSCULAR | Status: DC | PRN
  Administered 2013-03-17: 1 mg via INTRAVENOUS

## 2013-03-17 SURGICAL SUPPLY — 46 items
CLOTH BEACON ORANGE TIMEOUT ST (SAFETY) ×2 IMPLANT
CLSR STERI-STRIP ANTIMIC 1/2X4 (GAUZE/BANDAGES/DRESSINGS) ×2 IMPLANT
DRAPE INCISE IOBAN 66X45 STRL (DRAPES) ×2 IMPLANT
DRAPE POUCH INSTRU U-SHP 10X18 (DRAPES) ×2 IMPLANT
DRAPE U-SHAPE 47X51 STRL (DRAPES) ×2 IMPLANT
DRSG EMULSION OIL 3X3 NADH (GAUZE/BANDAGES/DRESSINGS) ×2 IMPLANT
DRSG MEPILEX BORDER 4X4 (GAUZE/BANDAGES/DRESSINGS) ×2 IMPLANT
DRSG PAD ABDOMINAL 8X10 ST (GAUZE/BANDAGES/DRESSINGS) ×2 IMPLANT
ELECT REM PT RETURN 9FT ADLT (ELECTROSURGICAL) ×2
ELECTRODE REM PT RTRN 9FT ADLT (ELECTROSURGICAL) ×1 IMPLANT
GLOVE BIO SURGEON STRL SZ7.5 (GLOVE) ×2 IMPLANT
GLOVE BIO SURGEON STRL SZ8 (GLOVE) ×2 IMPLANT
GLOVE EUDERMIC 7 POWDERFREE (GLOVE) ×2 IMPLANT
GLOVE SS BIOGEL STRL SZ 7.5 (GLOVE) ×1 IMPLANT
GLOVE SUPERSENSE BIOGEL SZ 7.5 (GLOVE) ×1
GOWN STRL NON-REIN LRG LVL3 (GOWN DISPOSABLE) ×2 IMPLANT
GOWN STRL REIN XL XLG (GOWN DISPOSABLE) ×4 IMPLANT
KIT BASIN OR (CUSTOM PROCEDURE TRAY) ×2 IMPLANT
KIT ROOM TURNOVER OR (KITS) ×2 IMPLANT
MANIFOLD NEPTUNE II (INSTRUMENTS) ×2 IMPLANT
NDL SUT 6 .5 CRC .975X.05 MAYO (NEEDLE) ×1 IMPLANT
NEEDLE 22X1 1/2 (OR ONLY) (NEEDLE) IMPLANT
NEEDLE MAYO TAPER (NEEDLE) ×1
NS IRRIG 1000ML POUR BTL (IV SOLUTION) ×2 IMPLANT
PACK SHOULDER (CUSTOM PROCEDURE TRAY) ×2 IMPLANT
PAD ARMBOARD 7.5X6 YLW CONV (MISCELLANEOUS) ×4 IMPLANT
SLING ARM FOAM STRAP MED (SOFTGOODS) ×2 IMPLANT
SPONGE GAUZE 4X4 12PLY (GAUZE/BANDAGES/DRESSINGS) ×2 IMPLANT
SPONGE LAP 4X18 X RAY DECT (DISPOSABLE) ×4 IMPLANT
STRIP CLOSURE SKIN 1/2X4 (GAUZE/BANDAGES/DRESSINGS) ×2 IMPLANT
SUCTION FRAZIER TIP 10 FR DISP (SUCTIONS) ×2 IMPLANT
SUT BONE WAX W31G (SUTURE) IMPLANT
SUT ETHIBOND NAB CT1 #1 30IN (SUTURE) ×4 IMPLANT
SUT FIBERWIRE #2 38 REV NDL BL (SUTURE)
SUT MNCRL AB 3-0 PS2 18 (SUTURE) ×2 IMPLANT
SUT VIC AB 0 CT1 27 (SUTURE) ×1
SUT VIC AB 0 CT1 27XBRD ANBCTR (SUTURE) ×1 IMPLANT
SUT VIC AB 2-0 CT1 27 (SUTURE) ×2
SUT VIC AB 2-0 CT1 TAPERPNT 27 (SUTURE) ×2 IMPLANT
SUT VICRYL 4-0 PS2 18IN ABS (SUTURE) ×2 IMPLANT
SUTURE FIBERWR#2 38 REV NDL BL (SUTURE) IMPLANT
SYR CONTROL 10ML LL (SYRINGE) ×2 IMPLANT
TOWEL OR 17X24 6PK STRL BLUE (TOWEL DISPOSABLE) ×2 IMPLANT
TOWEL OR 17X26 10 PK STRL BLUE (TOWEL DISPOSABLE) ×2 IMPLANT
WATER STERILE IRR 1000ML POUR (IV SOLUTION) ×2 IMPLANT
YANKAUER SUCT BULB TIP NO VENT (SUCTIONS) IMPLANT

## 2013-03-17 NOTE — Discharge Instructions (Signed)
OK for light use of left arm, sling for comfort May change dressing in 2 days and then as needed Keep incision dry for 5 days OK to move elbow wrist and hand  What to eat:  For your first meals, you should eat lightly; only small meals initially.  If you do not have nausea, you may eat larger meals.  Avoid spicy, greasy and heavy food.    General Anesthesia, Adult, Care After  Refer to this sheet in the next few weeks. These instructions provide you with information on caring for yourself after your procedure. Your health care provider may also give you more specific instructions. Your treatment has been planned according to current medical practices, but problems sometimes occur. Call your health care provider if you have any problems or questions after your procedure.  WHAT TO EXPECT AFTER THE PROCEDURE  After the procedure, it is typical to experience:  Sleepiness.  Nausea and vomiting. HOME CARE INSTRUCTIONS  For the first 24 hours after general anesthesia:  Have a responsible person with you.  Do not drive a car. If you are alone, do not take public transportation.  Do not drink alcohol.  Do not take medicine that has not been prescribed by your health care provider.  Do not sign important papers or make important decisions.  You may resume a normal diet and activities as directed by your health care provider.  Change bandages (dressings) as directed.  If you have questions or problems that seem related to general anesthesia, call the hospital and ask for the anesthetist or anesthesiologist on call. SEEK MEDICAL CARE IF:  You have nausea and vomiting that continue the day after anesthesia.  You develop a rash. SEEK IMMEDIATE MEDICAL CARE IF:  You have difficulty breathing.  You have chest pain.  You have any allergic problems. Document Released: 06/04/2000 Document Revised: 10/29/2012 Document Reviewed: 09/11/2012  [facility] Patient Information 2014 [place], [place].

## 2013-03-17 NOTE — Anesthesia Procedure Notes (Signed)
Anesthesia Regional Block:  Interscalene brachial plexus block  Pre-Anesthetic Checklist: ,, timeout performed, Correct Patient, Correct Site, Correct Laterality, Correct Procedure, Correct Position, site marked, Risks and benefits discussed,  Surgical consent,  Pre-op evaluation,  At surgeon's request and post-op pain management  Laterality: Left  Prep: chloraprep       Needles:  Injection technique: Single-shot  Needle Type: Echogenic Stimulator Needle     Needle Length: 5cm 5 cm Needle Gauge: 22 and 22 G    Additional Needles:  Procedures: ultrasound guided (picture in chart) and nerve stimulator Interscalene brachial plexus block  Nerve Stimulator or Paresthesia:  Response: biceps flexion, 0.45 mA,   Additional Responses:   Narrative:  Start time: 03/17/2013 2:11 PM End time: 03/17/2013 2:23 PM Injection made incrementally with aspirations every 5 mL.  Performed by: Personally  Anesthesiologist: Dr Marcie Bal  Additional Notes: Functioning IV was confirmed and monitors were applied.  A 42mm 22ga Arrow echogenic stimulator needle was used. Sterile prep and drape,hand hygiene and sterile gloves were used.  Negative aspiration and negative test dose prior to incremental administration of local anesthetic. The patient tolerated the procedure well.  Ultrasound guidance: relevent anatomy identified, needle position confirmed, local anesthetic spread visualized around nerve(s), vascular puncture avoided.  Image printed for medical record.

## 2013-03-17 NOTE — H&P (Signed)
Alicia Hobbs    Chief Complaint: Left Shoulder Retained Hardware HPI: The patient is a 68 y.o. female with retained hardware left shoulder with joint penetration.  Past Medical History  Diagnosis Date  . UTI (lower urinary tract infection)     Past Surgical History  Procedure Laterality Date  . 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 Orif humerus fracture Left 11/13/2012    Procedure: OPEN REDUCTION INTERNAL FIXATION (ORIF) PROXIMAL HUMERUS FRACTURE;  Surgeon: Marin Shutter, MD;  Location: [facility];  Service: Orthopedics;  Laterality: Left;    History reviewed. No pertinent family history.  Social History:  reports that she has never smoked. She has never used smokeless tobacco. She reports that she does not drink alcohol or use illicit drugs.  Allergies:  Allergies  Allergen Reactions  . Percocet [Oxycodone-Acetaminophen] Nausea And Vomiting  . Biaxin [Clarithromycin]     Medications Prior to Admission  Medication Sig Dispense Refill  . acetaminophen (TYLENOL) 500 MG tablet Take 1,000 mg by mouth every 6 (six) hours as needed.      Marland Kitchen b complex vitamins tablet Take 1 tablet by mouth daily.      . cyanocobalamin (EQL VITAMIN B-12) 500 MCG tablet Take 500 mcg by mouth daily.      Marland Kitchen doxylamine, Sleep, (EQ NIGHTTIME SLEEP AID, DOXYL,) 25 MG tablet Take 25 mg by mouth at bedtime as needed for sleep.      . Misc Natural Products (NARCOSOFT HERBAL LAX PO) Take 1 tablet by mouth daily as needed (constipation).      . Multiple Vitamins-Minerals (EYE VITAMINS) TABS Take 1 tablet by mouth daily.      . Multiple Vitamins-Minerals (ZINC PO) Take 1 tablet by mouth daily.      Marland Kitchen omeprazole (PRILOSEC) 20 MG capsule Take 20 mg by mouth daily as needed.      . vitamin C (ASCORBIC ACID) 500 MG tablet Take 500 mg by mouth daily.      Marland Kitchen VITAMIN D, CHOLECALCIFEROL, PO Take 2,500 Units by mouth daily.      Marland Kitchen VITAMIN E PO Take 1 tablet by mouth daily.      . diphenhydrAMINE (BENADRYL) 25 MG tablet  Take 25 mg by mouth every 6 (six) hours as needed for itching.         Physical Exam: left shoulder incision well healed, good motion. Exam as noted at recent office visit  Vitals  Resp:  [20] 20 (01/06 1420) BP: (131)/(46) 131/46 mmHg (01/06 1420)  Assessment/Plan  Impression: Left Shoulder Retained Hardware  Plan of Action: Procedure(s): HARDWARE REMOVAL WITH POSSIBLE REVISION OF OPEN REDUCTION INTERNAL FIXATION (ORIF) LEFT SHOULDER FRACTURE  Cheri Ayotte M 03/17/2013, 2:36 PM

## 2013-03-17 NOTE — Anesthesia Preprocedure Evaluation (Signed)
Anesthesia Evaluation  Patient identified by MRN, date of birth, ID band Patient awake    Reviewed: Allergy & Precautions, H&P , NPO status , Patient's Chart, lab work & pertinent test results  Airway Mallampati: II  Neck ROM: full    Dental   Pulmonary neg pulmonary ROS,          Cardiovascular negative cardio ROS      Neuro/Psych    GI/Hepatic   Endo/Other    Renal/GU      Musculoskeletal   Abdominal   Peds  Hematology   Anesthesia Other Findings   Reproductive/Obstetrics                           Anesthesia Physical Anesthesia Plan  ASA: I  Anesthesia Plan: General and Regional   Post-op Pain Management: MAC Combined w/ Regional for Post-op pain   Induction: Intravenous  Airway Management Planned: Oral ETT  Additional Equipment:   Intra-op Plan:   Post-operative Plan: Extubation in OR  Informed Consent: I have reviewed the patients History and Physical, chart, labs and discussed the procedure including the risks, benefits and alternatives for the proposed anesthesia with the patient or authorized representative who has indicated his/her understanding and acceptance.     Plan Discussed with: CRNA, Anesthesiologist and Surgeon  Anesthesia Plan Comments:         Anesthesia Quick Evaluation

## 2013-03-17 NOTE — Op Note (Signed)
03/17/2013  3:39 PM  PATIENT:   Alicia Hobbs  68 y.o. female  PRE-OPERATIVE DIAGNOSIS:  Left Shoulder Retained Hardware  POST-OPERATIVE DIAGNOSIS:  same  PROCEDURE:  Hardware removal from left shoulder  SURGEON:  Jannette Cotham, Metta Clines M.D.  ASSISTANTS: Shuford pac   ANESTHESIA:   GET + ISB  EBL: minimal  SPECIMEN:  none  Drains: none   PATIENT DISPOSITION:  PACU - hemodynamically stable.    PLAN OF CARE: Discharge to home after PACU  Dictation# [id number]

## 2013-03-17 NOTE — Preoperative (Signed)
Beta Blockers   Reason not to administer Beta Blockers:Not Applicable 

## 2013-03-17 NOTE — Transfer of Care (Signed)
Immediate Anesthesia Transfer of Care Note  Patient: Alicia Hobbs  Procedure(s) Performed: Procedure(s): HARDWARE REMOVAL  (Left)  Patient Location: PACU  Anesthesia Type:GA combined with regional for post-op pain  Level of Consciousness: awake, alert  and oriented  Airway & Oxygen Therapy: Patient Spontanous Breathing and Patient connected to nasal cannula oxygen  Post-op Assessment: Report given to PACU RN and Post -op Vital signs reviewed and stable  Post vital signs: Reviewed and stable  Complications: No apparent anesthesia complications

## 2013-03-18 ENCOUNTER — Encounter (HOSPITAL_COMMUNITY): Payer: Self-pay | Admitting: Orthopedic Surgery

## 2013-03-18 NOTE — Op Note (Signed)
NAMESHELENE, KRAGE NO.:  [id number]  MEDICAL RECORD NO.:  [id number]  LOCATION:  [facility]                         FACILITY:  [facility]  PHYSICIAN:  Metta Clines. Jveon Pound, M.D.  DATE OF BIRTH:  Sep 28, 1945  DATE OF PROCEDURE:  03/17/2013 DATE OF DISCHARGE:                              OPERATIVE REPORT   PREOPERATIVE DIAGNOSIS:  Retained hardware, left shoulder.  POSTOPERATIVE DIAGNOSIS:  Retained hardware, left shoulder.  PROCEDURE:  Hardware removal of the left shoulder in the form of 3 pegs removed from a previously placed SNP plate.  SURGEON:  Metta Clines. Sarie Stall, M.D.  Terrence DupontOlivia Mackie A. Shuford, PA-C  ANESTHESIA:  General endotracheal as well as interscalene block.  ESTIMATED BLOOD LOSS:  Minimal.  DRAINS:  None.  HISTORY:  Ms. Grawe is a 68 year old female who sustained a displaced 2-part left proximal humerus fracture, and I performed an open reduction and internal fixation number of months ago.  Postoperatively, I have been following her x-rays, and there has been some settling of the humeral head and apparent migration of one of the inferior pegs through the subchondral bone and potentially into the glenohumeral joint space.  There does appear to be bony consolidation at the fracture site, and to hopefully avoid damage to the glenohumeral joint.  We plan to perform a removal of the retained pegs today.  Preoperatively, I counseled Ms. Cruey regarding treatment options and risks versus benefits thereof.  Possible surgical complications are reviewed with potential for bleeding, infection, neurovascular injury, loss of fixation, possible need for additional surgery.  She understands and accepts and agrees to plan.  PROCEDURE IN DETAIL:  After undergoing routine preop evaluation, the patient did receive prophylactic antibiotics.  An interscalene block was established in the holding area by the Anesthesia Department.  Placed supine on the operating  table, underwent smooth induction of general endotracheal anesthesia.  We then moved into beach-chair position and appropriate padded and protected.  The left shoulder girdle region was sterilely prepped and draped in standard fashion.  Time-out was called. Through her previous lateral deltoid-splitting incision, I opened the middle third with an approximately 3 cm incision.  Sharp dissection carried down through the skin, subcu, and then split the deltoid along the course of the previous incision.  This allowed immediate access to the head of the SNP plate.  This was exposed removing the soft tissue using combination of blunt and sharp dissection.  We then readily identified the heads of the 2 inferior screws, which we removed and then obtained live fluoroscopic imaging to confirm that they had been properly removed, and there was an additional superior screw that we removed since it was also somewhat close to the subchondral bone.  We did leave 2 pegs behind which were well within the humeral head and also did a stress test of the fracture site and saw good stability, good mottling, and overall construct appeared to be stable.  The wound was then irrigated.  The deltoid split was then reapproximated with 0 Vicryl sutures, 2-0 Vicryl for subcu layer, and intracuticular 3-0 Monocryl for the skin followed by Steri-Strips.  Dry dressing was applied.  Left arm was placed in a  sling, and the patient was then awakened, extubated, and taken to recovery room in stable condition.     Metta Clines. Uchenna Rappaport, M.D.     KMS/MEDQ  D:  03/17/2013  T:  03/18/2013  Job:  [id number]

## 2013-03-20 NOTE — Anesthesia Postprocedure Evaluation (Signed)
Anesthesia Post Note  Patient: Alicia Hobbs  Procedure(s) Performed: Procedure(s) (LRB): HARDWARE REMOVAL  (Left)  Anesthesia type: General  Patient location: PACU  Post pain: Pain level controlled and Adequate analgesia  Post assessment: Post-op Vital signs reviewed, Patient's Cardiovascular Status Stable, Respiratory Function Stable, Patent Airway and Pain level controlled  Last Vitals:  Filed Vitals:   03/17/13 1806  BP: 134/85  Pulse: 59  Temp:   Resp: 15    Post vital signs: Reviewed and stable  Level of consciousness: awake, alert  and oriented  Complications: No apparent anesthesia complications

## 2015-02-28 DIAGNOSIS — H25042 Posterior subcapsular polar age-related cataract, left eye: Secondary | ICD-10-CM | POA: Diagnosis not present

## 2015-02-28 DIAGNOSIS — Z961 Presence of intraocular lens: Secondary | ICD-10-CM | POA: Diagnosis not present

## 2015-02-28 DIAGNOSIS — H53009 Unspecified amblyopia, unspecified eye: Secondary | ICD-10-CM | POA: Diagnosis not present

## 2015-02-28 DIAGNOSIS — H25012 Cortical age-related cataract, left eye: Secondary | ICD-10-CM | POA: Diagnosis not present

## 2015-03-01 DIAGNOSIS — Z23 Encounter for immunization: Secondary | ICD-10-CM | POA: Diagnosis not present

## 2015-03-09 IMAGING — CR DG WRIST COMPLETE 3+V*L*
3 series · 3 of 3 positions shown · non-contrast
Comparison: None.

CLINICAL DATA: Fall.  Left wrist pain.

LEFT WRIST - COMPLETE 3+ VIEW

[x wrist obl left]
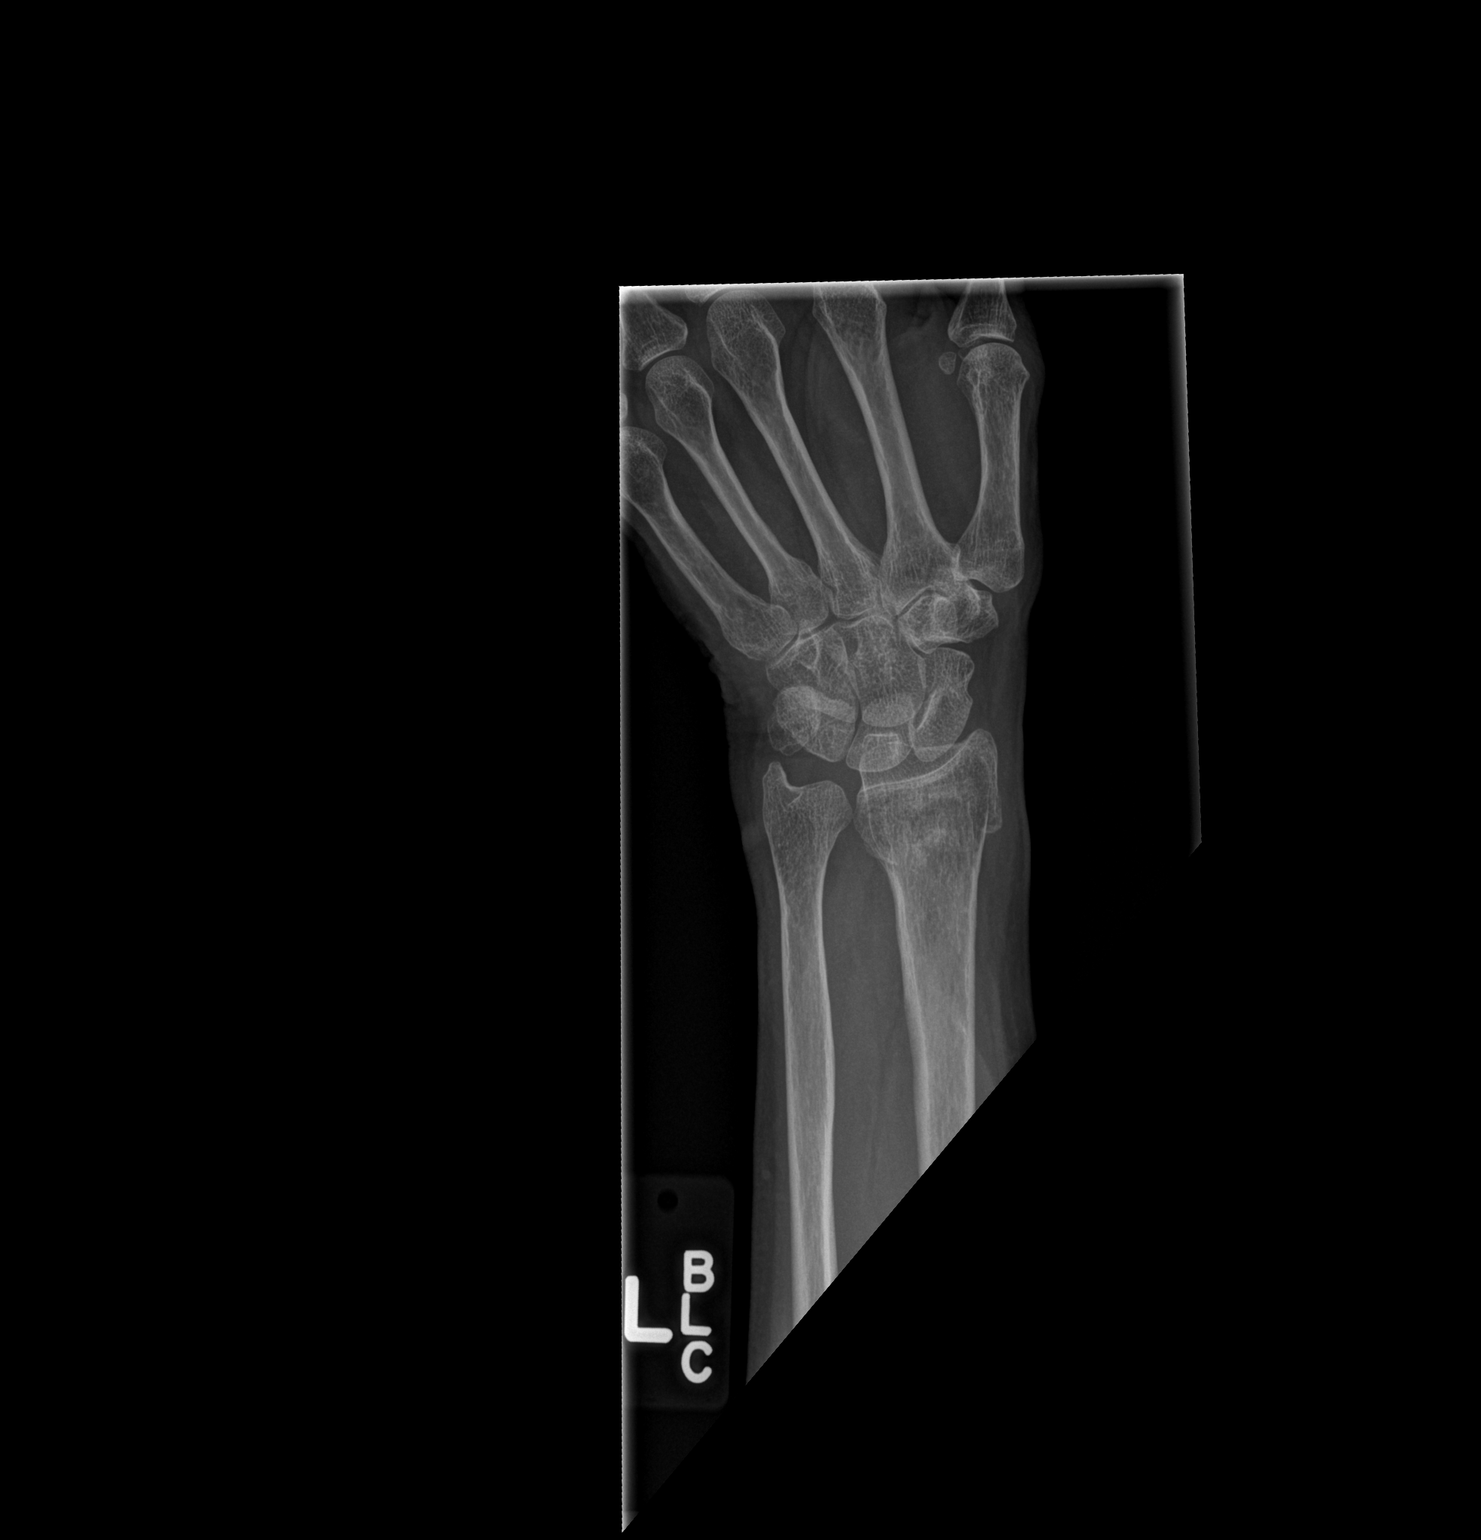

[x wrist lat left]
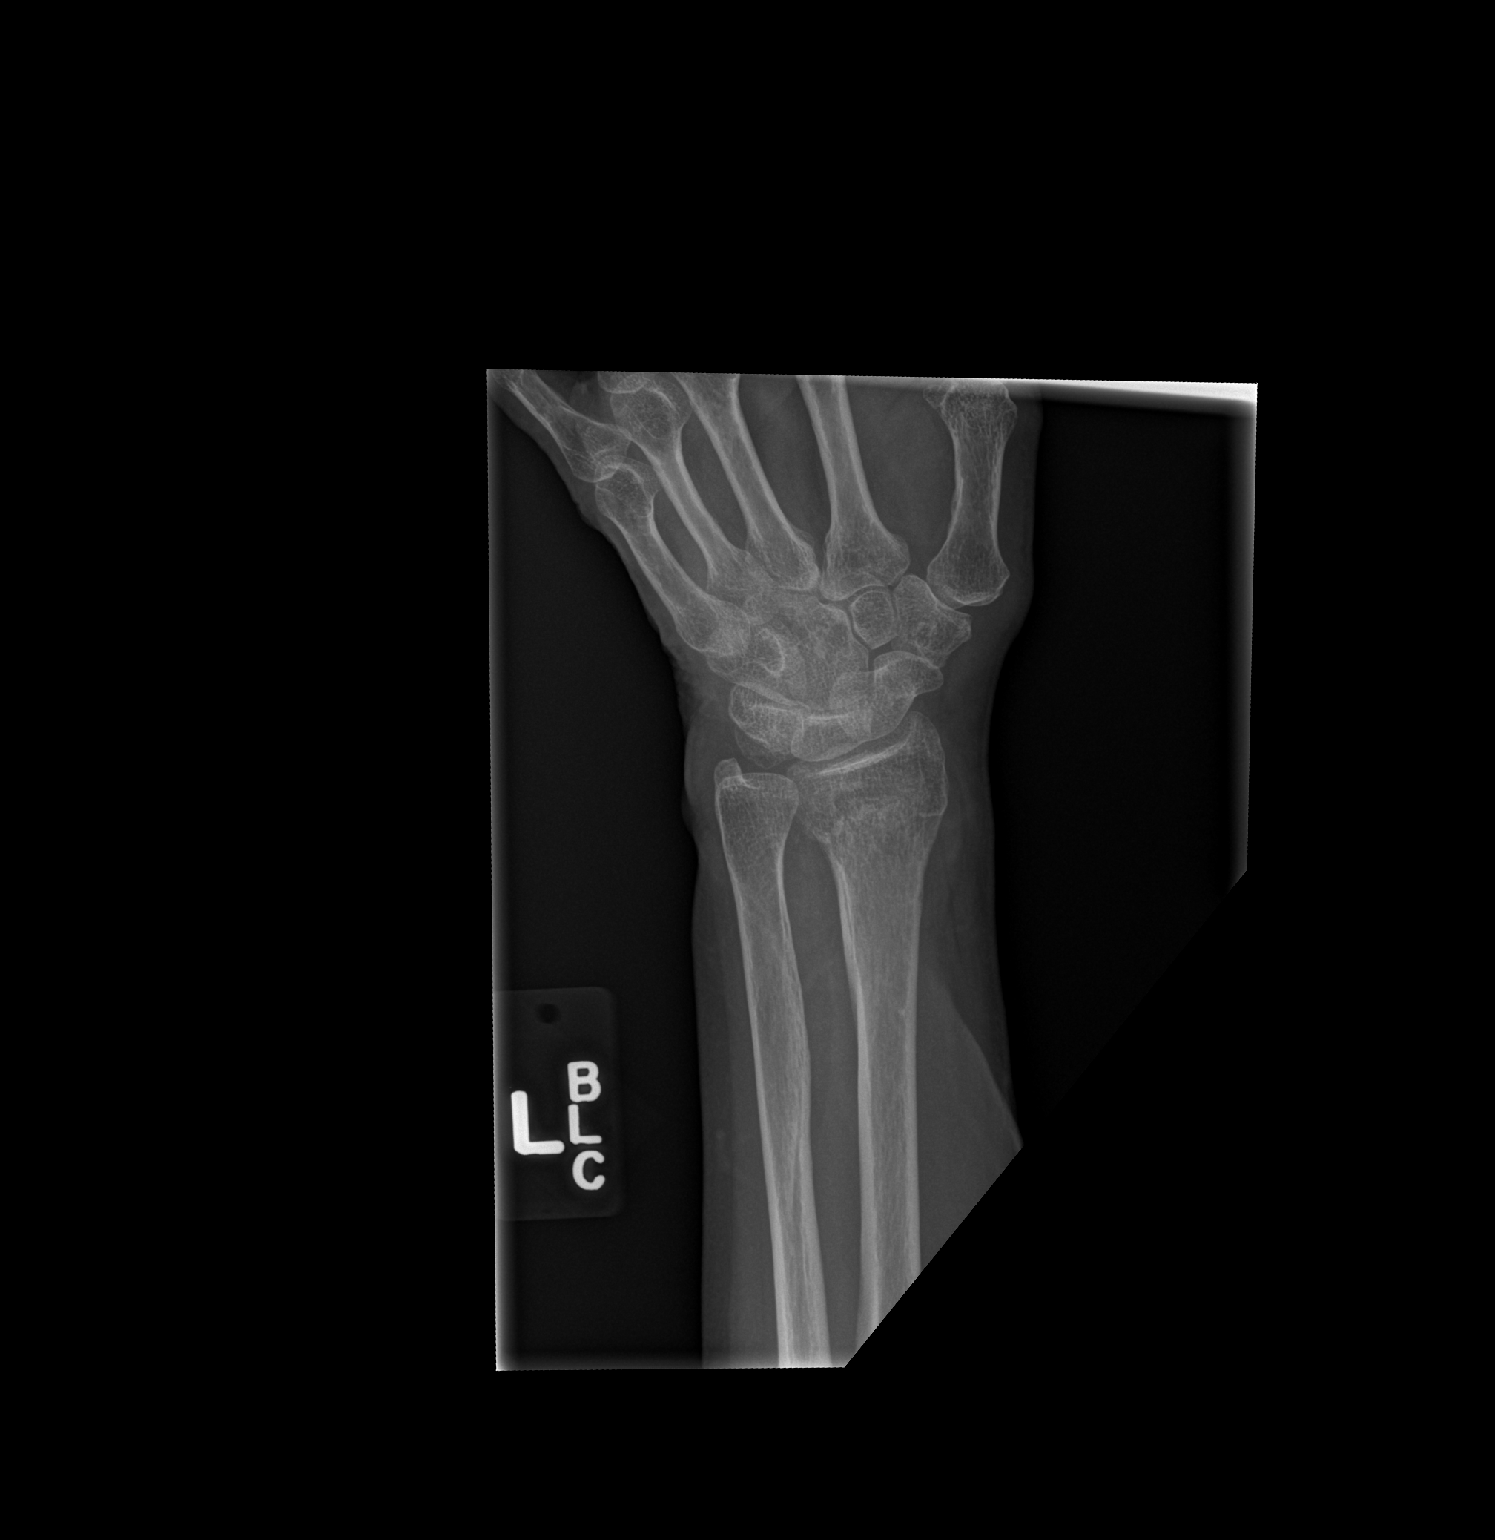

[x wrist navicular view left]
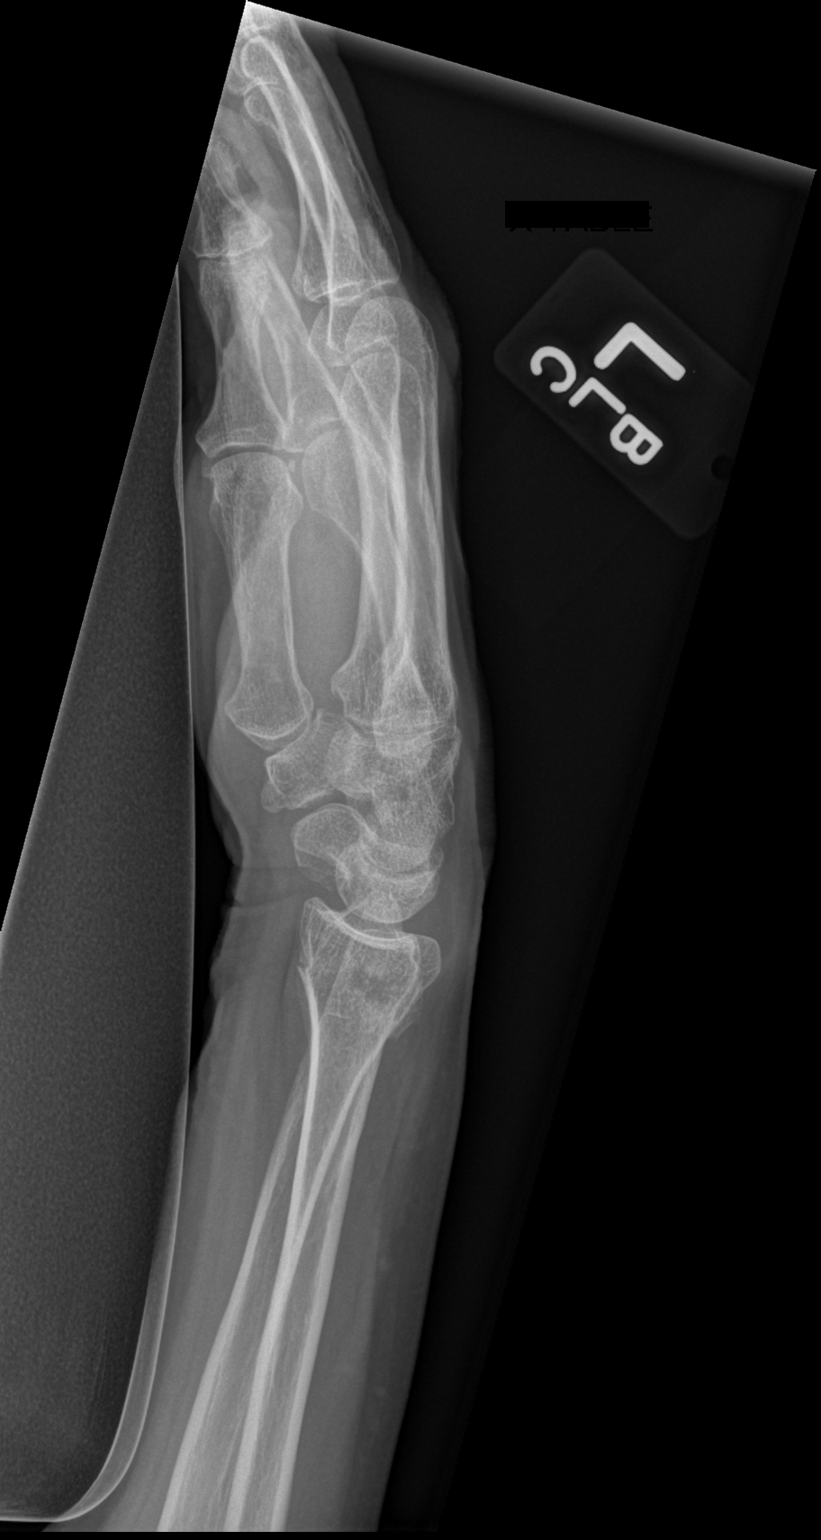

[3 of 3 positions shown; findings below may reference images not displayed]

FINDINGS: There is a transverse fracture of the distal radial
metaphysis.  This leads to mild dorsal angulation of the articular
surface of 12 degrees.  This is due to mild impaction of the dorsal
aspect of the fracture.  There is no displacement.  There are no
other fractures.  The wrist joints are normally spaced and aligned.
There is surrounding soft tissue swelling.  The bones are
demineralized.
IMPRESSION: Transverse fracture of the distal left radial metaphysis.

## 2015-07-18 IMAGING — RF DG C-ARM 61-120 MIN
1 series · 2 of 2 positions shown · non-contrast
Comparison: 11/13/2012

CLINICAL DATA: Hardware removal left shoulder

EXAM:
LEFT SHOULDER - 2+ VIEW; DG C-ARM 1-60 MIN

[Series 1: run · 2 of 2 slices shown]
[im 1/2]
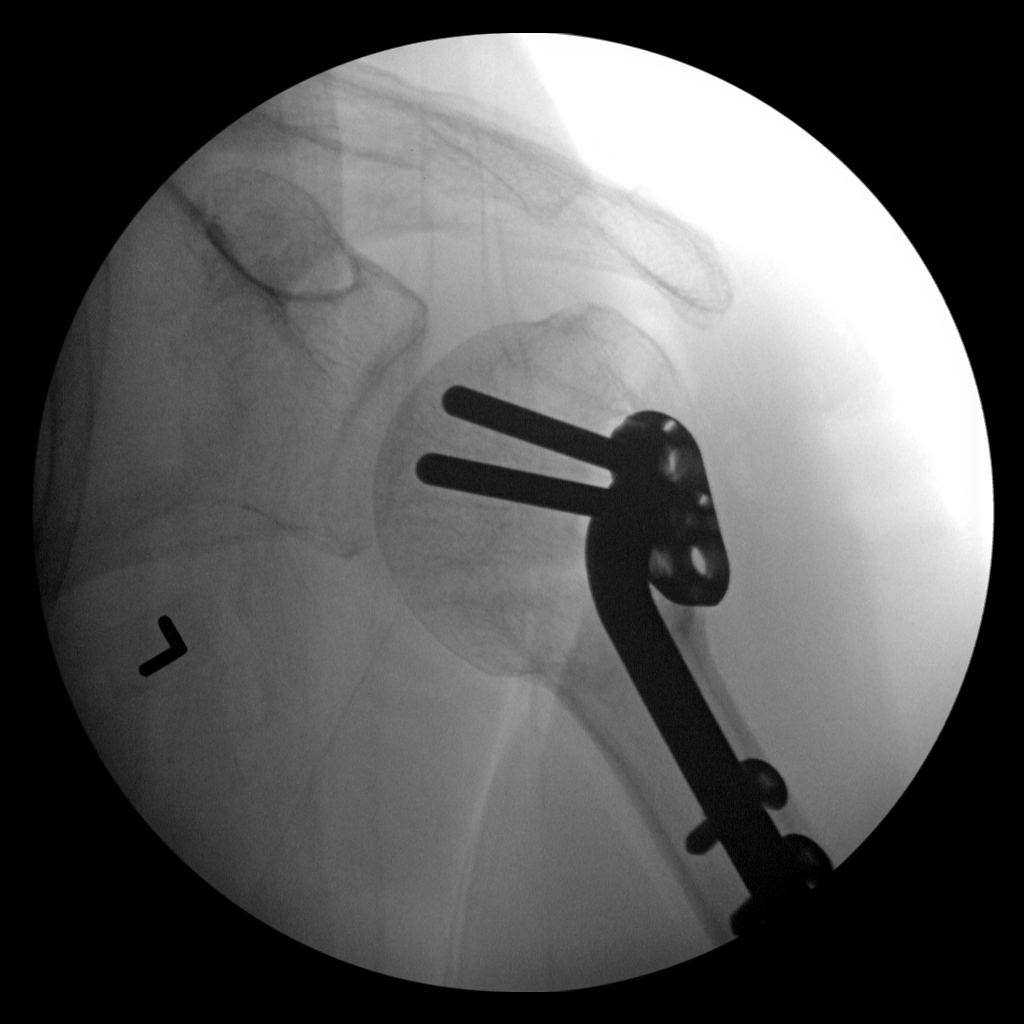
[im 2/2]
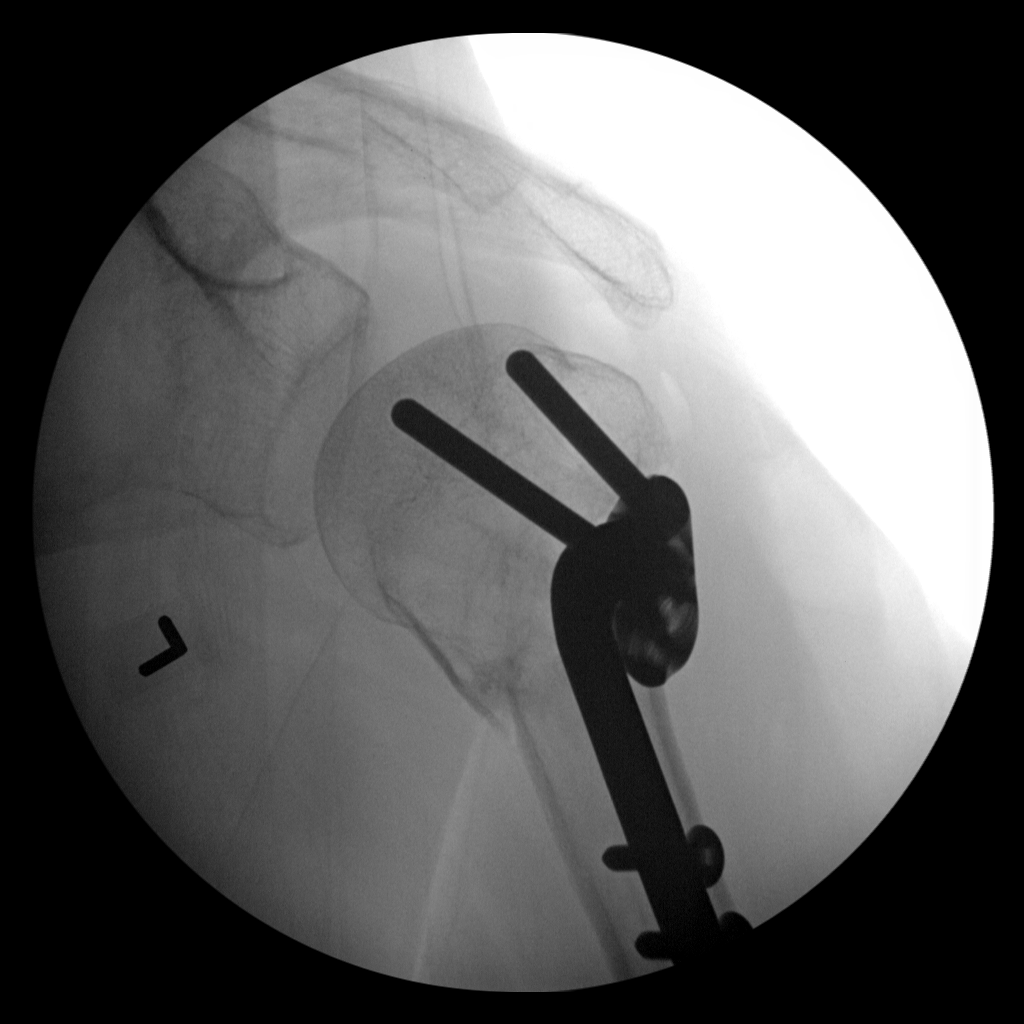

[2 of 2 positions shown; findings below may reference images not displayed]

FINDINGS: There has been prior plate and screw fixation of a humeral neck
fracture which appears healed. Two of the 5 screws extending into
the humeral head have been removed. Remaining hardware remains in
place.
IMPRESSION: Two of the 5 screws extending into the humeral head have been
removed. Humeral plate remains in place.

## 2015-10-12 DIAGNOSIS — R69 Illness, unspecified: Secondary | ICD-10-CM | POA: Diagnosis not present

## 2015-10-30 DIAGNOSIS — R69 Illness, unspecified: Secondary | ICD-10-CM | POA: Diagnosis not present

## 2016-01-19 DIAGNOSIS — M79601 Pain in right arm: Secondary | ICD-10-CM | POA: Diagnosis not present

## 2016-05-25 DIAGNOSIS — J019 Acute sinusitis, unspecified: Secondary | ICD-10-CM | POA: Diagnosis not present

## 2016-05-30 DIAGNOSIS — Z0000 Encounter for general adult medical examination without abnormal findings: Secondary | ICD-10-CM | POA: Diagnosis not present

## 2016-05-30 DIAGNOSIS — Z6826 Body mass index (BMI) 26.0-26.9, adult: Secondary | ICD-10-CM | POA: Diagnosis not present

## 2016-05-30 DIAGNOSIS — N959 Unspecified menopausal and perimenopausal disorder: Secondary | ICD-10-CM | POA: Diagnosis not present

## 2016-05-30 DIAGNOSIS — Z23 Encounter for immunization: Secondary | ICD-10-CM | POA: Diagnosis not present

## 2016-05-30 DIAGNOSIS — Z1211 Encounter for screening for malignant neoplasm of colon: Secondary | ICD-10-CM | POA: Diagnosis not present

## 2016-06-06 DIAGNOSIS — E785 Hyperlipidemia, unspecified: Secondary | ICD-10-CM | POA: Diagnosis not present

## 2016-06-06 DIAGNOSIS — Z136 Encounter for screening for cardiovascular disorders: Secondary | ICD-10-CM | POA: Diagnosis not present

## 2016-07-13 DIAGNOSIS — R69 Illness, unspecified: Secondary | ICD-10-CM | POA: Diagnosis not present

## 2016-08-02 DIAGNOSIS — A932 Colorado tick fever: Secondary | ICD-10-CM | POA: Diagnosis not present

## 2016-09-17 DIAGNOSIS — Z961 Presence of intraocular lens: Secondary | ICD-10-CM | POA: Diagnosis not present

## 2016-09-17 DIAGNOSIS — H53022 Refractive amblyopia, left eye: Secondary | ICD-10-CM | POA: Diagnosis not present

## 2016-09-17 DIAGNOSIS — H53002 Unspecified amblyopia, left eye: Secondary | ICD-10-CM | POA: Diagnosis not present

## 2016-09-17 DIAGNOSIS — H2512 Age-related nuclear cataract, left eye: Secondary | ICD-10-CM | POA: Diagnosis not present

## 2016-09-17 DIAGNOSIS — H26491 Other secondary cataract, right eye: Secondary | ICD-10-CM | POA: Diagnosis not present

## 2016-12-14 DIAGNOSIS — S79911A Unspecified injury of right hip, initial encounter: Secondary | ICD-10-CM | POA: Diagnosis not present

## 2016-12-14 DIAGNOSIS — M25552 Pain in left hip: Secondary | ICD-10-CM | POA: Diagnosis not present

## 2016-12-14 DIAGNOSIS — S79919A Unspecified injury of unspecified hip, initial encounter: Secondary | ICD-10-CM | POA: Diagnosis not present

## 2016-12-14 DIAGNOSIS — S72009A Fracture of unspecified part of neck of unspecified femur, initial encounter for closed fracture: Secondary | ICD-10-CM | POA: Diagnosis not present

## 2016-12-14 DIAGNOSIS — S72002A Fracture of unspecified part of neck of left femur, initial encounter for closed fracture: Secondary | ICD-10-CM | POA: Diagnosis not present

## 2016-12-14 DIAGNOSIS — W1839XA Other fall on same level, initial encounter: Secondary | ICD-10-CM | POA: Diagnosis not present

## 2016-12-14 DIAGNOSIS — S79912A Unspecified injury of left hip, initial encounter: Secondary | ICD-10-CM | POA: Diagnosis not present

## 2016-12-14 DIAGNOSIS — S72145A Nondisplaced intertrochanteric fracture of left femur, initial encounter for closed fracture: Secondary | ICD-10-CM | POA: Diagnosis not present

## 2016-12-14 DIAGNOSIS — Y998 Other external cause status: Secondary | ICD-10-CM | POA: Diagnosis not present

## 2016-12-14 DIAGNOSIS — G8911 Acute pain due to trauma: Secondary | ICD-10-CM | POA: Diagnosis not present

## 2016-12-14 DIAGNOSIS — R102 Pelvic and perineal pain: Secondary | ICD-10-CM | POA: Diagnosis not present

## 2016-12-25 DIAGNOSIS — S72145A Nondisplaced intertrochanteric fracture of left femur, initial encounter for closed fracture: Secondary | ICD-10-CM | POA: Diagnosis not present

## 2017-01-22 DIAGNOSIS — M25552 Pain in left hip: Secondary | ICD-10-CM | POA: Diagnosis not present

## 2017-01-22 DIAGNOSIS — S72115D Nondisplaced fracture of greater trochanter of left femur, subsequent encounter for closed fracture with routine healing: Secondary | ICD-10-CM | POA: Diagnosis not present

## 2017-01-24 DIAGNOSIS — R69 Illness, unspecified: Secondary | ICD-10-CM | POA: Diagnosis not present

## 2018-01-03 DIAGNOSIS — Z23 Encounter for immunization: Secondary | ICD-10-CM | POA: Diagnosis not present

## 2018-04-25 DIAGNOSIS — R21 Rash and other nonspecific skin eruption: Secondary | ICD-10-CM | POA: Diagnosis not present

## 2018-09-15 DIAGNOSIS — R69 Illness, unspecified: Secondary | ICD-10-CM | POA: Diagnosis not present

## 2018-10-02 DIAGNOSIS — W57XXXA Bitten or stung by nonvenomous insect and other nonvenomous arthropods, initial encounter: Secondary | ICD-10-CM | POA: Diagnosis not present

## 2018-10-02 DIAGNOSIS — Z1211 Encounter for screening for malignant neoplasm of colon: Secondary | ICD-10-CM | POA: Diagnosis not present

## 2018-10-02 DIAGNOSIS — S30861A Insect bite (nonvenomous) of abdominal wall, initial encounter: Secondary | ICD-10-CM | POA: Diagnosis not present

## 2018-10-02 DIAGNOSIS — R21 Rash and other nonspecific skin eruption: Secondary | ICD-10-CM | POA: Diagnosis not present

## 2018-11-05 DIAGNOSIS — Z1211 Encounter for screening for malignant neoplasm of colon: Secondary | ICD-10-CM | POA: Diagnosis not present

## 2018-11-05 DIAGNOSIS — R1310 Dysphagia, unspecified: Secondary | ICD-10-CM | POA: Diagnosis not present

## 2018-12-01 DIAGNOSIS — K2930 Chronic superficial gastritis without bleeding: Secondary | ICD-10-CM | POA: Diagnosis not present

## 2018-12-01 DIAGNOSIS — K635 Polyp of colon: Secondary | ICD-10-CM | POA: Diagnosis not present

## 2018-12-01 DIAGNOSIS — K5730 Diverticulosis of large intestine without perforation or abscess without bleeding: Secondary | ICD-10-CM | POA: Diagnosis not present

## 2018-12-01 DIAGNOSIS — K210 Gastro-esophageal reflux disease with esophagitis: Secondary | ICD-10-CM | POA: Diagnosis not present

## 2018-12-01 DIAGNOSIS — K2940 Chronic atrophic gastritis without bleeding: Secondary | ICD-10-CM | POA: Diagnosis not present

## 2018-12-01 DIAGNOSIS — K209 Esophagitis, unspecified: Secondary | ICD-10-CM | POA: Diagnosis not present

## 2018-12-01 DIAGNOSIS — D128 Benign neoplasm of rectum: Secondary | ICD-10-CM | POA: Diagnosis not present

## 2018-12-01 DIAGNOSIS — K3189 Other diseases of stomach and duodenum: Secondary | ICD-10-CM | POA: Diagnosis not present

## 2018-12-01 DIAGNOSIS — K2210 Ulcer of esophagus without bleeding: Secondary | ICD-10-CM | POA: Diagnosis not present

## 2018-12-01 DIAGNOSIS — K222 Esophageal obstruction: Secondary | ICD-10-CM | POA: Diagnosis not present

## 2018-12-01 DIAGNOSIS — K648 Other hemorrhoids: Secondary | ICD-10-CM | POA: Diagnosis not present

## 2018-12-01 DIAGNOSIS — K2950 Unspecified chronic gastritis without bleeding: Secondary | ICD-10-CM | POA: Diagnosis not present

## 2018-12-01 DIAGNOSIS — K649 Unspecified hemorrhoids: Secondary | ICD-10-CM | POA: Diagnosis not present

## 2018-12-01 DIAGNOSIS — Z1211 Encounter for screening for malignant neoplasm of colon: Secondary | ICD-10-CM | POA: Diagnosis not present

## 2019-03-31 DIAGNOSIS — Z96643 Presence of artificial hip joint, bilateral: Secondary | ICD-10-CM | POA: Diagnosis not present

## 2019-03-31 DIAGNOSIS — R69 Illness, unspecified: Secondary | ICD-10-CM | POA: Diagnosis not present

## 2019-03-31 DIAGNOSIS — Z96649 Presence of unspecified artificial hip joint: Secondary | ICD-10-CM | POA: Diagnosis not present

## 2019-06-16 DIAGNOSIS — Z961 Presence of intraocular lens: Secondary | ICD-10-CM | POA: Diagnosis not present

## 2019-06-16 DIAGNOSIS — H2512 Age-related nuclear cataract, left eye: Secondary | ICD-10-CM | POA: Diagnosis not present

## 2019-06-16 DIAGNOSIS — H26491 Other secondary cataract, right eye: Secondary | ICD-10-CM | POA: Diagnosis not present

## 2019-10-20 DIAGNOSIS — R69 Illness, unspecified: Secondary | ICD-10-CM | POA: Diagnosis not present

## 2020-03-24 DIAGNOSIS — Z23 Encounter for immunization: Secondary | ICD-10-CM | POA: Diagnosis not present

## 2021-01-02 DIAGNOSIS — Z23 Encounter for immunization: Secondary | ICD-10-CM | POA: Diagnosis not present

## 2021-07-07 DIAGNOSIS — S0006XA Insect bite (nonvenomous) of scalp, initial encounter: Secondary | ICD-10-CM | POA: Diagnosis not present

## 2021-07-07 DIAGNOSIS — W57XXXA Bitten or stung by nonvenomous insect and other nonvenomous arthropods, initial encounter: Secondary | ICD-10-CM | POA: Diagnosis not present

## 2021-07-31 DIAGNOSIS — H11441 Conjunctival cysts, right eye: Secondary | ICD-10-CM | POA: Diagnosis not present

## 2021-09-14 DIAGNOSIS — W57XXXD Bitten or stung by nonvenomous insect and other nonvenomous arthropods, subsequent encounter: Secondary | ICD-10-CM | POA: Diagnosis not present

## 2021-09-14 DIAGNOSIS — S1096XD Insect bite of unspecified part of neck, subsequent encounter: Secondary | ICD-10-CM | POA: Diagnosis not present

## 2021-09-14 DIAGNOSIS — L03221 Cellulitis of neck: Secondary | ICD-10-CM | POA: Diagnosis not present

## 2021-09-14 DIAGNOSIS — L089 Local infection of the skin and subcutaneous tissue, unspecified: Secondary | ICD-10-CM | POA: Diagnosis not present

## 2021-11-06 DIAGNOSIS — Z961 Presence of intraocular lens: Secondary | ICD-10-CM | POA: Diagnosis not present

## 2021-11-06 DIAGNOSIS — H2512 Age-related nuclear cataract, left eye: Secondary | ICD-10-CM | POA: Diagnosis not present

## 2021-11-06 DIAGNOSIS — H11441 Conjunctival cysts, right eye: Secondary | ICD-10-CM | POA: Diagnosis not present

## 2022-01-18 DIAGNOSIS — H1045 Other chronic allergic conjunctivitis: Secondary | ICD-10-CM | POA: Diagnosis not present

## 2022-02-28 DIAGNOSIS — H11121 Conjunctival concretions, right eye: Secondary | ICD-10-CM | POA: Diagnosis not present

## 2022-05-14 DIAGNOSIS — S70262A Insect bite (nonvenomous), left hip, initial encounter: Secondary | ICD-10-CM | POA: Diagnosis not present

## 2022-05-14 DIAGNOSIS — R21 Rash and other nonspecific skin eruption: Secondary | ICD-10-CM | POA: Diagnosis not present

## 2022-05-14 DIAGNOSIS — W57XXXA Bitten or stung by nonvenomous insect and other nonvenomous arthropods, initial encounter: Secondary | ICD-10-CM | POA: Diagnosis not present

## 2022-11-19 DIAGNOSIS — H53022 Refractive amblyopia, left eye: Secondary | ICD-10-CM | POA: Diagnosis not present

## 2022-11-19 DIAGNOSIS — H11121 Conjunctival concretions, right eye: Secondary | ICD-10-CM | POA: Diagnosis not present

## 2022-11-19 DIAGNOSIS — Z961 Presence of intraocular lens: Secondary | ICD-10-CM | POA: Diagnosis not present

## 2022-11-19 DIAGNOSIS — H2512 Age-related nuclear cataract, left eye: Secondary | ICD-10-CM | POA: Diagnosis not present

## 2022-11-19 DIAGNOSIS — H10401 Unspecified chronic conjunctivitis, right eye: Secondary | ICD-10-CM | POA: Diagnosis not present

## 2023-03-12 DIAGNOSIS — R300 Dysuria: Secondary | ICD-10-CM | POA: Diagnosis not present

## 2023-11-25 DIAGNOSIS — H40012 Open angle with borderline findings, low risk, left eye: Secondary | ICD-10-CM | POA: Diagnosis not present

## 2023-11-25 DIAGNOSIS — H11121 Conjunctival concretions, right eye: Secondary | ICD-10-CM | POA: Diagnosis not present

## 2023-11-25 DIAGNOSIS — H2512 Age-related nuclear cataract, left eye: Secondary | ICD-10-CM | POA: Diagnosis not present

## 2023-11-25 DIAGNOSIS — Z961 Presence of intraocular lens: Secondary | ICD-10-CM | POA: Diagnosis not present

## 2023-11-25 DIAGNOSIS — H10401 Unspecified chronic conjunctivitis, right eye: Secondary | ICD-10-CM | POA: Diagnosis not present
